# Patient Record
Sex: Male | Born: 1960 | Race: White | Hispanic: No | Marital: Married | Smoking: Never smoker
Health system: Southern US, Community
[De-identification: ages and names within clinical notes are randomized; demographics above are authoritative.]

## PROBLEM LIST (undated history)

## (undated) DIAGNOSIS — K56609 Unspecified intestinal obstruction, unspecified as to partial versus complete obstruction: Secondary | ICD-10-CM

## (undated) DIAGNOSIS — K219 Gastro-esophageal reflux disease without esophagitis: Secondary | ICD-10-CM

## (undated) DIAGNOSIS — H9190 Unspecified hearing loss, unspecified ear: Secondary | ICD-10-CM

## (undated) DIAGNOSIS — L03115 Cellulitis of right lower limb: Secondary | ICD-10-CM

## (undated) HISTORY — PX: COLON SURGERY: SHX602

---

## 1999-06-01 ENCOUNTER — Other Ambulatory Visit: Admission: RE | Admit: 1999-06-01 | Discharge: 1999-06-01 | Payer: Self-pay | Admitting: Urology

## 1999-07-17 ENCOUNTER — Ambulatory Visit (HOSPITAL_COMMUNITY): Admission: RE | Admit: 1999-07-17 | Discharge: 1999-07-17 | Payer: Self-pay | Admitting: Gastroenterology

## 1999-07-30 ENCOUNTER — Emergency Department (HOSPITAL_COMMUNITY): Admission: EM | Admit: 1999-07-30 | Discharge: 1999-07-31 | Payer: Self-pay | Admitting: Emergency Medicine

## 1999-08-22 ENCOUNTER — Ambulatory Visit (HOSPITAL_COMMUNITY): Admission: RE | Admit: 1999-08-22 | Discharge: 1999-08-22 | Payer: Self-pay | Admitting: Gastroenterology

## 1999-08-22 ENCOUNTER — Encounter: Payer: Self-pay | Admitting: Gastroenterology

## 2010-03-02 ENCOUNTER — Emergency Department (HOSPITAL_BASED_OUTPATIENT_CLINIC_OR_DEPARTMENT_OTHER): Admission: EM | Admit: 2010-03-02 | Discharge: 2010-03-03 | Payer: Self-pay | Admitting: Emergency Medicine

## 2013-01-20 ENCOUNTER — Emergency Department (INDEPENDENT_AMBULATORY_CARE_PROVIDER_SITE_OTHER)
Admission: EM | Admit: 2013-01-20 | Discharge: 2013-01-20 | Disposition: A | Discharge status: Home / Self Care | Payer: Worker's Compensation | Source: Home / Self Care

## 2013-01-20 DIAGNOSIS — M76829 Posterior tibial tendinitis, unspecified leg: Secondary | ICD-10-CM

## 2013-01-20 DIAGNOSIS — M76821 Posterior tibial tendinitis, right leg: Secondary | ICD-10-CM

## 2013-01-20 MED ORDER — KETOROLAC TROMETHAMINE 30 MG/ML IJ SOLN
30.0000 mg | Freq: Once | INTRAMUSCULAR | Status: AC
  Administered 2013-01-20: 30 mg via INTRAMUSCULAR

## 2013-01-20 MED ORDER — KETOROLAC TROMETHAMINE 30 MG/ML IJ SOLN
INTRAMUSCULAR | Status: AC
  Filled 2013-01-20: qty 1

## 2013-01-20 MED ORDER — KETOROLAC TROMETHAMINE 10 MG PO TABS: 10.0000 mg | ORAL_TABLET | Freq: Four times a day (QID) | ORAL | Status: DC | PRN

## 2013-01-20 NOTE — ED Provider Notes (Signed)
   History    CSN: [id number] Arrival date & time 01/20/13  1829  None    Chief Complaint  Patient presents with  . Leg Injury   (Consider location/radiation/quality/duration/timing/severity/associated sxs/prior Treatment) Patient is a 52 y.o. male presenting with leg pain. The history is provided by the patient.  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 No past surgical history on file. No family history on file. History  Substance Use Topics  . Smoking status: Not on file  . Smokeless tobacco: Not on file  . Alcohol Use: Not on file    Review of Systems  Constitutional: Negative.   Musculoskeletal: Positive for myalgias and gait problem. Negative for joint swelling.  Skin: Negative.     Allergies  Review of patient's allergies indicates no known allergies.  Home Medications   Current Outpatient Rx  Name  Route  Sig  Dispense  Refill  . ketorolac (TORADOL) 10 MG tablet   Oral   Take 1 tablet (10 mg total) by mouth every 6 (six) hours as needed for pain.   20 tablet   0    BP 124/81  Pulse 84  Temp(Src) 97.8 F (36.6 C) (Oral)  Resp 16  SpO2 100% Physical Exam  Nursing note and vitals reviewed. Constitutional: He is oriented to person, place, and time. He appears well-developed and well-nourished.  Musculoskeletal: He exhibits tenderness.       Right lower leg: He exhibits tenderness. He exhibits no bony tenderness, no swelling, no edema, no deformity and no laceration.       Legs: Neurological: He is alert and oriented to person, place, and time.  Skin: Skin is warm  and dry.    ED Course  Procedures (including critical care time) Labs Reviewed - No data to display No results found. 1. Tendonitis, tibialis, right     MDM    Linna Hoff, MD 01/20/13 2017

## 2013-01-20 NOTE — ED Notes (Signed)
Ice pack given to patient.

## 2013-01-20 NOTE — ED Notes (Signed)
Patient is here for right leg injury.  Patient was walking on a route and leg started hurting.

## 2013-01-21 ENCOUNTER — Emergency Department (HOSPITAL_BASED_OUTPATIENT_CLINIC_OR_DEPARTMENT_OTHER): Payer: 59

## 2013-01-21 ENCOUNTER — Encounter (HOSPITAL_BASED_OUTPATIENT_CLINIC_OR_DEPARTMENT_OTHER): Payer: Self-pay | Admitting: *Deleted

## 2013-01-21 ENCOUNTER — Emergency Department (HOSPITAL_BASED_OUTPATIENT_CLINIC_OR_DEPARTMENT_OTHER)
Admission: EM | Admit: 2013-01-21 | Discharge: 2013-01-21 | Disposition: A | Discharge status: Home / Self Care | Payer: 59 | Attending: Emergency Medicine | Admitting: Emergency Medicine

## 2013-01-21 DIAGNOSIS — L02419 Cutaneous abscess of limb, unspecified: Secondary | ICD-10-CM | POA: Insufficient documentation

## 2013-01-21 DIAGNOSIS — IMO0001 Reserved for inherently not codable concepts without codable children: Secondary | ICD-10-CM | POA: Insufficient documentation

## 2013-01-21 DIAGNOSIS — L0390 Cellulitis, unspecified: Secondary | ICD-10-CM

## 2013-01-21 DIAGNOSIS — Z792 Long term (current) use of antibiotics: Secondary | ICD-10-CM | POA: Insufficient documentation

## 2013-01-21 DIAGNOSIS — R21 Rash and other nonspecific skin eruption: Secondary | ICD-10-CM | POA: Insufficient documentation

## 2013-01-21 DIAGNOSIS — Z23 Encounter for immunization: Secondary | ICD-10-CM | POA: Insufficient documentation

## 2013-01-21 MED ORDER — CLINDAMYCIN PHOSPHATE 600 MG/50ML IV SOLN
600.0000 mg | Freq: Once | INTRAVENOUS | Status: AC
  Administered 2013-01-21: 600 mg via INTRAVENOUS
  Filled 2013-01-21: qty 50

## 2013-01-21 MED ORDER — TETANUS-DIPHTH-ACELL PERTUSSIS 5-2.5-18.5 LF-MCG/0.5 IM SUSP
0.5000 mL | Freq: Once | INTRAMUSCULAR | Status: AC
  Administered 2013-01-21: 0.5 mL via INTRAMUSCULAR
  Filled 2013-01-21: qty 0.5

## 2013-01-21 MED ORDER — CLINDAMYCIN HCL 300 MG PO CAPS: 300.0000 mg | ORAL_CAPSULE | Freq: Four times a day (QID) | ORAL | Status: DC

## 2013-01-21 NOTE — ED Notes (Signed)
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 Pt was seen at [facility] urgent care last night, given toradol injection and told to follow up. lle has larger area of redness and swelling noted to right calf, warm and tender to touch.

## 2013-01-21 NOTE — ED Notes (Signed)
Patient transported from MRI 

## 2013-01-21 NOTE — ED Provider Notes (Signed)
History    CSN: [id number] Arrival date & time 01/21/13  1041  First MD Initiated Contact with Patient 01/21/13 1110     Chief Complaint  Patient presents with  . Leg Pain   (Consider location/radiation/quality/duration/timing/severity/associated sxs/prior Treatment) HPI Comments: Patient presents with redness and swelling to his anterior right leg. He states he started noticing a little discomfort 2 days ago. 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 He states he's had some burning and tenderness on walking to his right lower leg. It's been constant but is worse with walking and driving. He's had some mild swelling to the area. He says at times that the pain radiates to his calf. He has some mild numbness in between the toes of his first 2 toes. He denies a fevers. He denies any chest pain or shortness of breath. He was seen in urgent care yesterday and was diagnosed with tendinitis. He states this morning the redness and swelling was worse so he came in here for evaluation. He was given a dose of Toradol in the urgent care last night. He's unsure when his last tetanus shot was. He denies any known injuries to the area.  Patient is a 52 y.o. male presenting with leg pain.  Leg Pain Associated symptoms: no back pain, no fatigue and no fever    History reviewed. No pertinent past medical history. History reviewed. No pertinent past surgical history. History reviewed. No pertinent family history. History  Substance Use Topics  . Smoking status: Not on file  . Smokeless tobacco: Not on file  . Alcohol Use: Not on file    Review of Systems  Constitutional: Negative for fever, chills, diaphoresis and fatigue.  HENT: Negative for congestion, rhinorrhea and sneezing.   Eyes: Negative.   Respiratory: Negative for cough, chest tightness and shortness of breath.   Cardiovascular: Negative for chest pain and leg swelling.  Gastrointestinal: Negative for nausea,  vomiting, abdominal pain, diarrhea and blood in stool.  Genitourinary: Negative for frequency, hematuria, flank pain and difficulty urinating.  Musculoskeletal: Positive for myalgias. Negative for back pain and arthralgias.  Skin: Positive for rash.  Neurological: Negative for dizziness, speech difficulty, weakness, numbness and headaches.    Allergies  Review of patient's allergies indicates no known allergies.  Home Medications   Current Outpatient Rx  Name  Route  Sig  Dispense  Refill  . clindamycin (CLEOCIN) 300 MG capsule   Oral   Take 1 capsule (300 mg total) by mouth 4 (four) times daily. X 7 days   28 capsule   0   . ketorolac (TORADOL) 10 MG tablet   Oral   Take 1 tablet (10 mg total) by mouth every 6 (six) hours as needed for pain.   20 tablet   0    BP 137/78  Pulse 86  Temp(Src) 98.5 F (36.9 C) (Oral)  Resp 18  SpO2 99% Physical Exam  Constitutional: He is oriented to person, place, and time. He appears well-developed and well-nourished.  HENT:  Head: Normocephalic and atraumatic.  Eyes: Pupils are equal, round, and reactive to light.  Neck: Normal range of motion. Neck supple.  Cardiovascular: Normal rate, regular rhythm and normal heart sounds.   Pulmonary/Chest: Effort normal and breath sounds normal. No respiratory distress. He has no wheezes. He has no rales. He exhibits no tenderness.  Abdominal: Soft. Bowel sounds are normal. There is no tenderness. There is no rebound and no guarding.  Musculoskeletal: Normal range of motion. He exhibits no edema.  Patient has some mild swelling with associated warmth and erythema to the anterior portion of his right lower leg. He has about a 6 cm area of erythema to this area. There's no induration or fluctuance. He has some tenderness on palpation of the erythematous area as well as to his posterior calf. He is neurovascularly intact distally.  Lymphadenopathy:    He has no cervical adenopathy.  Neurological: He  is alert and oriented to person, place, and time.  Skin: Skin is warm and dry. No rash noted.  Psychiatric: He has a normal mood and affect.    ED Course  Procedures (including critical care time) Labs Reviewed - No data to display US Venous Img Lower Unilateral Right  01/21/2013    *RADIOLOGY REPORT*  Right lower extremity venous duplex ultrasound  History:  Right lower extremity pain and swelling  Technique:  Real-time and Doppler interrogation of the right lower extremity venous system was performed.  Findings:  Flow in the venous structures of the right lower extremity is spontaneous and phasic in all segments.  There is normal compression and augmentation in the venous structures of the right lower extremity.  Venous Doppler signal is normal in all regions.  There is no thrombus in the deep or visualized superficial venous structures on the right.  There is no right- sided deep venous incompetence.  Conclusion:  No evidence of right lower extremity deep venous thrombosis.   Original Report Authenticated By: Bretta Bang, M.D.   1. Cellulitis     MDM  Patient no evidence of DVT. He was given a dose of IV clindamycin and started on oral clindamycin. He was advised and elevation the leg. He was advised to return in 2 days for recheck or sooner if his symptoms worsen or the redness starts to spread.  Rolan Bucco, MD 01/21/13 1318

## 2013-01-22 NOTE — ED Notes (Signed)
Call from patient on answering machine , @9 :58a , yesterday 7-10, w request call back. Returned call , and patient stated he was seen in another facility at the direction of the nurse care line

## 2013-01-23 ENCOUNTER — Emergency Department (HOSPITAL_BASED_OUTPATIENT_CLINIC_OR_DEPARTMENT_OTHER)
Admission: EM | Admit: 2013-01-23 | Discharge: 2013-01-23 | Disposition: A | Discharge status: Home / Self Care | Payer: 59 | Attending: Emergency Medicine | Admitting: Emergency Medicine

## 2013-01-23 ENCOUNTER — Encounter (HOSPITAL_BASED_OUTPATIENT_CLINIC_OR_DEPARTMENT_OTHER): Payer: Self-pay

## 2013-01-23 DIAGNOSIS — L03119 Cellulitis of unspecified part of limb: Secondary | ICD-10-CM | POA: Insufficient documentation

## 2013-01-23 DIAGNOSIS — M7989 Other specified soft tissue disorders: Secondary | ICD-10-CM | POA: Insufficient documentation

## 2013-01-23 DIAGNOSIS — L03115 Cellulitis of right lower limb: Secondary | ICD-10-CM

## 2013-01-23 DIAGNOSIS — L02419 Cutaneous abscess of limb, unspecified: Secondary | ICD-10-CM | POA: Insufficient documentation

## 2013-01-23 HISTORY — DX: Cellulitis of right lower limb: L03.115

## 2013-01-23 NOTE — ED Notes (Signed)
MD at bedside. 

## 2013-01-23 NOTE — ED Notes (Signed)
Recheck of right leg.  Pt was seen and treated for cellulitis of right leg on Thursday.

## 2013-01-23 NOTE — ED Provider Notes (Signed)
History    CSN: [id number] Arrival date & time 01/23/13  1009  First MD Initiated Contact with Patient 01/23/13 1028     Chief Complaint  Patient presents with  . Follow-up   (Consider location/radiation/quality/duration/timing/severity/associated sxs/prior Treatment) HPI Comments: Pt had developed redness, swelling, pain worse with standing or walking early in the week, seen in the ED 2 days ago, had negative U/S and treated with IV abx.  Has been taking oral abx, keeping leg elevated.  Was told to get recheck today.  No fevers at home.  Reports redness, pain, swelling are all improved.    Patient is a 52 y.o. male presenting with wound check. The history is provided by the patient, the spouse and medical records.  Wound Check This is a new problem. Pertinent negatives include no chest pain and no shortness of breath. The symptoms are relieved by position and lying down.   Past Medical History  Diagnosis Date  . Cellulitis of right anterior lower leg    History reviewed. No pertinent past surgical history. History reviewed. No pertinent family history. History  Substance Use Topics  . Smoking status: Never Smoker   . Smokeless tobacco: Not on file  . Alcohol Use: No    Review of Systems  Constitutional: Negative for fever and chills.  Respiratory: Negative for shortness of breath.   Cardiovascular: Positive for leg swelling. Negative for chest pain.  Skin: Positive for color change.    Allergies  Review of patient's allergies indicates no known allergies.  Home Medications   Current Outpatient Rx  Name  Route  Sig  Dispense  Refill  . clindamycin (CLEOCIN) 300 MG capsule   Oral   Take 1 capsule (300 mg total) by mouth 4 (four) times daily. X 7 days   28 capsule   0   . ketorolac (TORADOL) 10 MG tablet   Oral   Take 1 tablet (10 mg total) by mouth every 6 (six) hours as needed for pain.   20 tablet   0    BP 132/66  Pulse 81  Temp(Src) 98.4 F (36.9 C)  (Oral)  Resp 16  Ht 5\' 11"  (1.803 m)  Wt 230 lb (104.327 kg)  BMI 32.09 kg/m2  SpO2 96% Physical Exam  Nursing note and vitals reviewed. Constitutional: He appears well-developed and well-nourished. No distress.  HENT:  Head: Normocephalic and atraumatic.  Cardiovascular: Normal pulses.   Musculoskeletal:       Right lower leg: He exhibits swelling. He exhibits no tenderness, no bony tenderness, no edema, no deformity and no laceration.  Neurological: He is alert. He displays no atrophy and no tremor. No sensory deficit. He exhibits normal muscle tone. GCS eye subscore is 4. GCS verbal subscore is 5. GCS motor subscore is 6.  Skin: Skin is warm and dry. No rash noted. He is not diaphoretic.    ED Course  Procedures (including critical care time) Labs Reviewed - No data to display US Venous Img Lower Unilateral Right  01/21/2013    *RADIOLOGY REPORT*  Right lower extremity venous duplex ultrasound  History:  Right lower extremity pain and swelling  Technique:  Real-time and Doppler interrogation of the right lower extremity venous system was performed.  Findings:  Flow in the venous structures of the right lower extremity is spontaneous and phasic in all segments.  There is normal compression and augmentation in the venous structures of the right lower extremity.  Venous Doppler signal is normal in all  regions.  There is no thrombus in the deep or visualized superficial venous structures on the right.  There is no right- sided deep venous incompetence.  Conclusion:  No evidence of right lower extremity deep venous thrombosis.   Original Report Authenticated By: Bretta Bang, M.D.   1. Cellulitis of right lower leg    ra sat is 96% and I interpret to be adequate MDM  Pt's swelling, redness and pain are all improving.  No fevers, dizziness.  Appears to be healing well.  Pt will continue oral abx and follow up with PCP on Tuesday to determine ability to return to work at that time.     Gavin Pound. Tomia Enlow, MD 01/23/13 1128

## 2013-01-23 NOTE — Discharge Instructions (Signed)
Cellulitis Cellulitis is an infection of the skin and the tissue beneath it. The infected area is usually red and tender. Cellulitis occurs most often in the arms and lower legs.  CAUSES  Cellulitis is caused by bacteria that enter the skin through cracks or cuts in the skin. The most common types of bacteria that cause cellulitis are Staphylococcus and Streptococcus. SYMPTOMS   Redness and warmth.  Swelling.  Tenderness or pain.  Fever. DIAGNOSIS  Your caregiver can usually determine what is wrong based on a physical exam. Blood tests may also be done. TREATMENT  Treatment usually involves taking an antibiotic medicine. HOME CARE INSTRUCTIONS   Take your antibiotics as directed. Finish them even if you start to feel better.  Keep the infected arm or leg elevated to reduce swelling.  Apply a warm cloth to the affected area up to 4 times per day to relieve pain.  Only take over-the-counter or prescription medicines for pain, discomfort, or fever as directed by your caregiver.  Keep all follow-up appointments as directed by your caregiver. SEEK MEDICAL CARE IF:   You notice red streaks coming from the infected area.  Your red area gets larger or turns dark in color.  Your bone or joint underneath the infected area becomes painful after the skin has healed.  Your infection returns in the same area or another area.  You notice a swollen bump in the infected area.  You develop new symptoms. SEEK IMMEDIATE MEDICAL CARE IF:   You have a fever.  You feel very sleepy.  You develop vomiting or diarrhea.  You have a general ill feeling (malaise) with muscle aches and pains. MAKE SURE YOU:   Understand these instructions.  Will watch your condition.  Will get help right away if you are not doing well or get worse. Document Released: 04/10/2005 Document Revised: 12/31/2011 Document Reviewed: 09/16/2011 Exit[employer] Patient Information 2014 [employer].  

## 2013-10-20 ENCOUNTER — Ambulatory Visit
Admission: RE | Admit: 2013-10-20 | Discharge: 2013-10-20 | Disposition: A | Discharge status: Home / Self Care | Payer: 59 | Source: Ambulatory Visit | Attending: Family Medicine | Admitting: Family Medicine

## 2013-10-20 ENCOUNTER — Other Ambulatory Visit: Payer: Self-pay | Admitting: Family Medicine

## 2013-10-20 DIAGNOSIS — R05 Cough: Secondary | ICD-10-CM

## 2013-10-20 DIAGNOSIS — R059 Cough, unspecified: Secondary | ICD-10-CM

## 2013-10-20 DIAGNOSIS — R509 Fever, unspecified: Secondary | ICD-10-CM

## 2013-10-29 ENCOUNTER — Emergency Department (HOSPITAL_COMMUNITY)
Admission: EM | Admit: 2013-10-29 | Discharge: 2013-10-29 | Disposition: A | Discharge status: Home / Self Care | Attending: Emergency Medicine | Admitting: Emergency Medicine

## 2013-10-29 ENCOUNTER — Encounter (HOSPITAL_COMMUNITY): Payer: Self-pay | Admitting: Emergency Medicine

## 2013-10-29 DIAGNOSIS — Y9389 Activity, other specified: Secondary | ICD-10-CM | POA: Diagnosis not present

## 2013-10-29 DIAGNOSIS — Z872 Personal history of diseases of the skin and subcutaneous tissue: Secondary | ICD-10-CM | POA: Diagnosis not present

## 2013-10-29 DIAGNOSIS — Z23 Encounter for immunization: Secondary | ICD-10-CM | POA: Diagnosis not present

## 2013-10-29 DIAGNOSIS — S61409A Unspecified open wound of unspecified hand, initial encounter: Secondary | ICD-10-CM | POA: Insufficient documentation

## 2013-10-29 DIAGNOSIS — W540XXA Bitten by dog, initial encounter: Secondary | ICD-10-CM | POA: Diagnosis not present

## 2013-10-29 DIAGNOSIS — Y990 Civilian activity done for income or pay: Secondary | ICD-10-CM | POA: Diagnosis not present

## 2013-10-29 DIAGNOSIS — Y9289 Other specified places as the place of occurrence of the external cause: Secondary | ICD-10-CM | POA: Diagnosis not present

## 2013-10-29 MED ORDER — AMOXICILLIN-POT CLAVULANATE 875-125 MG PO TABS: 1.0000 | ORAL_TABLET | Freq: Two times a day (BID) | ORAL | Status: DC

## 2013-10-29 MED ORDER — TETANUS-DIPHTH-ACELL PERTUSSIS 5-2.5-18.5 LF-MCG/0.5 IM SUSP
0.5000 mL | Freq: Once | INTRAMUSCULAR | Status: AC
  Administered 2013-10-29: 0.5 mL via INTRAMUSCULAR
  Filled 2013-10-29: qty 0.5

## 2013-10-29 NOTE — ED Provider Notes (Signed)
Medical screening examination/treatment/procedure(s) were performed by non-physician practitioner and as supervising physician I was immediately available for consultation/collaboration.   EKG Interpretation None        Ianmichael Amescua, MD 10/29/13 2143 

## 2013-10-29 NOTE — ED Provider Notes (Signed)
CSN: [id number]     Arrival date & time 10/29/13  1620 History  This chart was scribed for non-physician practitioner, Emilia BeckKaitlyn Sophya Vanblarcom, PA-C, working with Gwyneth SproutWhitney Plunkett, MD by Charline BillsEssence Howell, ED Scribe. This patient was seen in room WTR7/WTR7 and the patient's care was started at 5:10 PM.    Chief Complaint  Patient presents with  . Animal Bite   Patient is a 53 y.o. male presenting with animal bite. The history is provided by the patient. No language interpreter was used.  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 male who presents to the Emergency Department complaining of dog bite on R hand that occurred today. 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 Pt states that he put up a hand-held shield that the dog bit through. Pt reports associated pain earlier today that has resolved. Bleeding is controlled. The dog's vaccines are UTD. Pt's Tetanus unknown.  Past Medical History  Diagnosis Date  . Cellulitis of right anterior lower leg    History reviewed. No pertinent past surgical history. History reviewed. No pertinent family history. History  Substance Use Topics  . Smoking status: Never Smoker   . Smokeless tobacco: Not on file  . Alcohol Use: No    Review of Systems  All other systems reviewed and are negative.   Allergies  Review of patient's allergies indicates no known allergies.  Home Medications   Prior to Admission medications   Medication Sig Start Date End Date Taking? Authorizing Provider  clindamycin (CLEOCIN) 300 MG capsule Take 1 capsule (300 mg total) by mouth 4 (four) times daily.  X 7 days 01/21/13   Rolan BuccoMelanie Belfi, MD  ketorolac (TORADOL) 10 MG tablet Take 1 tablet (10 mg total) by mouth every 6 (six) hours as needed for pain. 01/20/13   Linna HoffJames D Kindl, MD   Triage Vitals: BP 136/84  Pulse 84  Temp(Src) 98.8 F (37.1 C) (Oral)  Resp 16  SpO2 99% Physical Exam  Nursing note and vitals reviewed. Constitutional: He is oriented to person, place, and time. He appears well-developed and well-nourished. No distress.  HENT:  Head: Normocephalic and atraumatic.  Eyes: EOM are normal.  Neck: Neck supple.  Cardiovascular: Normal rate.   Pulmonary/Chest: Effort normal. No respiratory distress.  Musculoskeletal: Normal range of motion.  Full ROM of R hand    Neurological: He is alert and oriented to person, place, and time.  Skin: Skin is warm and dry.  Puncture wound noted to volar aspect of first MCP  Bleeding controlled No drainage  Distal capillary refill intact Sensation intact  Psychiatric: He has a normal mood and affect. His behavior is normal.    ED Course  Procedures (including critical care time) DIAGNOSTIC STUDIES: Oxygen Saturation is 99% on RA, normal by my interpretation.    COORDINATION OF CARE: 5:13 PM-Discussed treatment plan which includes saline bath and antibiotics with pt at bedside and pt agreed to plan.   Labs Review Labs Reviewed - No data to display  Imaging Review No results found.   EKG Interpretation None      MDM  Final diagnoses:  Dog bite    6:00 PM Patient's wound cleaned and bandaged with bacitracin ointment. Patient given tdap. Vitals stable and patient afebrile. Patient will be discharged with Augmentin for dog bite. No neurovascular compromise. Patient advised to return with worsening or concerning symptoms.   I personally performed the services described in this documentation, which was scribed in my presence. The recorded information has been reviewed and is accurate.    Emilia BeckKaitlyn Bentzion Dauria, PA-C 10/29/13  1801

## 2013-10-29 NOTE — ED Notes (Addendum)
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   EMS vitals: 140/98, HR 90, 16 RR,. Pt denies pain at this time. Bleeding was controlled on scene and EMS wrapped hand with gauze.

## 2013-10-29 NOTE — Discharge Instructions (Signed)
Keep wound clean and covered. Take antibiotics as directed until gone. Refer to attached documents for more information.

## 2014-02-03 ENCOUNTER — Encounter (HOSPITAL_BASED_OUTPATIENT_CLINIC_OR_DEPARTMENT_OTHER): Payer: Self-pay | Admitting: Emergency Medicine

## 2014-02-03 ENCOUNTER — Emergency Department (HOSPITAL_BASED_OUTPATIENT_CLINIC_OR_DEPARTMENT_OTHER): Payer: Managed Care, Other (non HMO)

## 2014-02-03 ENCOUNTER — Inpatient Hospital Stay (HOSPITAL_BASED_OUTPATIENT_CLINIC_OR_DEPARTMENT_OTHER)
Admission: EM | Admit: 2014-02-03 | Discharge: 2014-02-07 | DRG: 390 | Disposition: A | Discharge status: Home / Self Care | Payer: Managed Care, Other (non HMO) | Attending: General Surgery | Admitting: General Surgery

## 2014-02-03 DIAGNOSIS — D72829 Elevated white blood cell count, unspecified: Secondary | ICD-10-CM | POA: Diagnosis present

## 2014-02-03 DIAGNOSIS — K56609 Unspecified intestinal obstruction, unspecified as to partial versus complete obstruction: Secondary | ICD-10-CM

## 2014-02-03 DIAGNOSIS — Z79899 Other long term (current) drug therapy: Secondary | ICD-10-CM | POA: Diagnosis not present

## 2014-02-03 DIAGNOSIS — E869 Volume depletion, unspecified: Secondary | ICD-10-CM | POA: Diagnosis present

## 2014-02-03 DIAGNOSIS — R1110 Vomiting, unspecified: Secondary | ICD-10-CM | POA: Diagnosis not present

## 2014-02-03 DIAGNOSIS — K565 Intestinal adhesions [bands], unspecified as to partial versus complete obstruction: Principal | ICD-10-CM | POA: Diagnosis present

## 2014-02-03 LAB — CBC
HEMATOCRIT: 44.1 % (ref 39.0–52.0)
Hemoglobin: 15.4 g/dL (ref 13.0–17.0)
MCH: 32 pg (ref 26.0–34.0)
MCHC: 34.9 g/dL (ref 30.0–36.0)
MCV: 91.7 fL (ref 78.0–100.0)
Platelets: 170 10*3/uL (ref 150–400)
RBC: 4.81 MIL/uL (ref 4.22–5.81)
RDW: 12.3 % (ref 11.5–15.5)
WBC: 13.3 10*3/uL — ABNORMAL HIGH (ref 4.0–10.5)

## 2014-02-03 LAB — COMPREHENSIVE METABOLIC PANEL
ALBUMIN: 5 g/dL (ref 3.5–5.2)
ALK PHOS: 72 U/L (ref 39–117)
ALT: 20 U/L (ref 0–53)
AST: 24 U/L (ref 0–37)
Anion gap: 19 — ABNORMAL HIGH (ref 5–15)
BUN: 30 mg/dL — ABNORMAL HIGH (ref 6–23)
CALCIUM: 10.4 mg/dL (ref 8.4–10.5)
CO2: 25 mEq/L (ref 19–32)
Chloride: 98 mEq/L (ref 96–112)
Creatinine, Ser: 1.2 mg/dL (ref 0.50–1.35)
GFR calc non Af Amer: 67 mL/min — ABNORMAL LOW (ref >90)
GFR, EST AFRICAN AMERICAN: 78 mL/min — AB (ref >90)
GLUCOSE: 125 mg/dL — AB (ref 70–99)
POTASSIUM: 4.3 meq/L (ref 3.7–5.3)
SODIUM: 142 meq/L (ref 137–147)
TOTAL PROTEIN: 8.9 g/dL — AB (ref 6.0–8.3)
Total Bilirubin: 2.1 mg/dL — ABNORMAL HIGH (ref 0.3–1.2)

## 2014-02-03 LAB — HEMOGLOBIN A1C
HEMOGLOBIN A1C: 5.4 % (ref <5.7)
MEAN PLASMA GLUCOSE: 108 mg/dL (ref <117)

## 2014-02-03 LAB — CBC WITH DIFFERENTIAL/PLATELET
BASOS PCT: 0 % (ref 0–1)
Basophils Absolute: 0 10*3/uL (ref 0.0–0.1)
EOS ABS: 0.1 10*3/uL (ref 0.0–0.7)
EOS PCT: 0 % (ref 0–5)
HCT: 48.9 % (ref 39.0–52.0)
Hemoglobin: 17.4 g/dL — ABNORMAL HIGH (ref 13.0–17.0)
LYMPHS ABS: 1.1 10*3/uL (ref 0.7–4.0)
Lymphocytes Relative: 6 % — ABNORMAL LOW (ref 12–46)
MCH: 31.7 pg (ref 26.0–34.0)
MCHC: 35.6 g/dL (ref 30.0–36.0)
MCV: 89.1 fL (ref 78.0–100.0)
Monocytes Absolute: 1.7 10*3/uL — ABNORMAL HIGH (ref 0.1–1.0)
Monocytes Relative: 10 % (ref 3–12)
NEUTROS PCT: 84 % — AB (ref 43–77)
Neutro Abs: 14.5 10*3/uL — ABNORMAL HIGH (ref 1.7–7.7)
Platelets: 235 10*3/uL (ref 150–400)
RBC: 5.49 MIL/uL (ref 4.22–5.81)
RDW: 12.3 % (ref 11.5–15.5)
WBC: 17.3 10*3/uL — ABNORMAL HIGH (ref 4.0–10.5)

## 2014-02-03 LAB — CREATININE, SERUM
CREATININE: 1.01 mg/dL (ref 0.50–1.35)
GFR calc Af Amer: >90 mL/min (ref >90)
GFR, EST NON AFRICAN AMERICAN: 83 mL/min — AB (ref >90)

## 2014-02-03 LAB — LIPASE, BLOOD: Lipase: 25 U/L (ref 11–59)

## 2014-02-03 MED ORDER — SODIUM CHLORIDE 0.9 % IV SOLN: INTRAVENOUS | Status: DC

## 2014-02-03 MED ORDER — ONDANSETRON HCL 4 MG/2ML IJ SOLN: 4.0000 mg | Freq: Four times a day (QID) | INTRAMUSCULAR | Status: DC | PRN

## 2014-02-03 MED ORDER — ACETAMINOPHEN 650 MG RE SUPP: 650.0000 mg | Freq: Four times a day (QID) | RECTAL | Status: DC | PRN

## 2014-02-03 MED ORDER — ACETAMINOPHEN 325 MG PO TABS
650.0000 mg | ORAL_TABLET | Freq: Four times a day (QID) | ORAL | Status: DC | PRN
  Administered 2014-02-04 – 2014-02-06 (×5): 650 mg via ORAL
  Filled 2014-02-03 (×5): qty 2

## 2014-02-03 MED ORDER — ONDANSETRON HCL 4 MG PO TABS: 4.0000 mg | ORAL_TABLET | Freq: Four times a day (QID) | ORAL | Status: DC | PRN

## 2014-02-03 MED ORDER — MORPHINE SULFATE 4 MG/ML IJ SOLN
INTRAMUSCULAR | Status: AC
  Filled 2014-02-03: qty 1

## 2014-02-03 MED ORDER — MORPHINE SULFATE 4 MG/ML IJ SOLN
4.0000 mg | INTRAMUSCULAR | Status: DC | PRN
  Administered 2014-02-03: 4 mg via INTRAVENOUS

## 2014-02-03 MED ORDER — DEXTROSE-NACL 5-0.9 % IV SOLN
INTRAVENOUS | Status: DC
  Administered 2014-02-03: 18:00:00 via INTRAVENOUS

## 2014-02-03 MED ORDER — HEPARIN SODIUM (PORCINE) 5000 UNIT/ML IJ SOLN
5000.0000 [IU] | Freq: Three times a day (TID) | INTRAMUSCULAR | Status: DC
  Administered 2014-02-03 – 2014-02-07 (×11): 5000 [IU] via SUBCUTANEOUS
  Filled 2014-02-03 (×14): qty 1

## 2014-02-03 MED ORDER — MORPHINE SULFATE 4 MG/ML IJ SOLN
4.0000 mg | Freq: Once | INTRAMUSCULAR | Status: AC
  Administered 2014-02-03: 4 mg via INTRAVENOUS
  Filled 2014-02-03: qty 1

## 2014-02-03 MED ORDER — IOHEXOL 300 MG/ML  SOLN
100.0000 mL | Freq: Once | INTRAMUSCULAR | Status: AC | PRN
  Administered 2014-02-03: 100 mL via INTRAVENOUS

## 2014-02-03 MED ORDER — MORPHINE SULFATE 2 MG/ML IJ SOLN
1.0000 mg | INTRAMUSCULAR | Status: DC | PRN
  Administered 2014-02-04: 1 mg via INTRAVENOUS
  Filled 2014-02-03: qty 1

## 2014-02-03 MED ORDER — DEXTROSE-NACL 5-0.9 % IV SOLN
INTRAVENOUS | Status: DC
  Administered 2014-02-03: 75 mL/h via INTRAVENOUS
  Administered 2014-02-05: 01:00:00 via INTRAVENOUS

## 2014-02-03 MED ORDER — ONDANSETRON HCL 4 MG/2ML IJ SOLN
4.0000 mg | Freq: Once | INTRAMUSCULAR | Status: AC
  Administered 2014-02-03: 4 mg via INTRAVENOUS
  Filled 2014-02-03: qty 2

## 2014-02-03 MED ORDER — SODIUM CHLORIDE 0.9 % IV BOLUS (SEPSIS)
1000.0000 mL | Freq: Once | INTRAVENOUS | Status: AC
  Administered 2014-02-03: 1000 mL via INTRAVENOUS

## 2014-02-03 MED ORDER — PANTOPRAZOLE SODIUM 40 MG IV SOLR
40.0000 mg | INTRAVENOUS | Status: DC
  Administered 2014-02-03 – 2014-02-06 (×4): 40 mg via INTRAVENOUS
  Filled 2014-02-03 (×5): qty 40

## 2014-02-03 MED ORDER — IOHEXOL 300 MG/ML  SOLN
50.0000 mL | Freq: Once | INTRAMUSCULAR | Status: AC | PRN
  Administered 2014-02-03: 50 mL via ORAL

## 2014-02-03 NOTE — ED Notes (Signed)
[facility]

## 2014-02-03 NOTE — H&P (Signed)
[facility] History and Physical  George Ruiz ZOX:[id number] DOB: 12/26/1960 DOA: 02/03/2014  Referring physician: Rochele RaringKristen Ward, DO PCP: Thora LanceEHINGER,ROBERT R, MD   Chief Complaint: Vomiting  HPI: George Ruiz is a 53 y.o. male who has a history of partial bowel removal when he was a teen presents with complaints of vomiting and abdominal pain. [facility] with these symptoms an xray was abnormal and he was told to go to the ED. Patient states that he had been fine until Wednesday and even had a normal BM at that time. He has not been able to keep anything down including liquids. [facility] he was noted to have SBO and sent here for further evaluation and assessment. He does admit having a history of an appendectomy and also abdominal surgery as a teen. Patient states that he does not smoke and does not drink heavily. He is on no other medications.   Review of Systems:  Constitutional:  No weight loss, night sweats, Fevers, chills, fatigue.  HEENT:  No headaches Cardio-vascular:  No chest pain, Orthopnea, PND, palpitations  GI:  No heartburn, ++indigestion, ++abdominal pain, ++nausea, ++vomiting, no diarrhea, ++loss of appetite  Resp:  No shortness of breath with exertion or at rest. No excess mucus, no productive cough, No coughing up of blood.No wheezing  Skin:  no rash or lesions.  GU:  no dysuria, change in color of urine, no urgency or frequency. Musculoskeletal:  No joint pain or swelling. No decreased range of motion. Psych:  No change in mood or affect. No depression or anxiety. No memory loss.   Past Medical History  Diagnosis Date  . Cellulitis of right anterior lower leg    Past Surgical History  Procedure Laterality Date  . Colon surgery     Social History:  reports that he has never smoked. He does not have any smokeless tobacco history on file. He reports that he does not drink alcohol or use illicit  drugs.  No Known Allergies  History reviewed. No pertinent family history.   Prior to Admission medications   Medication Sig Start Date End Date Taking? Authorizing Provider  acetaminophen (TYLENOL) 325 MG tablet Take 650 mg by mouth every 6 (six) hours as needed (pain).   Yes Historical Provider, MD   Physical Exam: Filed Vitals:   02/03/14 1200 02/03/14 1449  BP: 121/93 125/72  Pulse: 76 75  Temp: 97.9 F (36.6 C) 98.8 F (37.1 C)  TempSrc: Oral Oral  Resp: 16 18  Height: 5\' 11"  (1.803 m)   Weight: 108.863 kg (240 lb)   SpO2: 100% 95%    Wt Readings from Last 3 Encounters:  02/03/14 108.863 kg (240 lb)  01/23/13 104.327 kg (230 lb)    General:  Appears calm and comfortable Eyes: PERRL, normal lids, irises & conjunctiva ENT: grossly normal hearing, lips & tongue Neck: no LAD, masses or thyromegaly Cardiovascular: RRR, no m/r/g. No LE edema. Respiratory: CTA bilaterally, no w/r/r. Normal respiratory effort. Abdomen: soft, ntnd no organomegaly Skin: no rash or induration seen on limited exam Musculoskeletal: grossly normal tone BUE/BLE Psychiatric: grossly normal mood and affect, speech fluent and appropriate Neurologic: grossly non-focal.          Labs on Admission:  Basic Metabolic Panel:  Recent Labs Lab 02/03/14 1215  NA 142  K 4.3  CL 98  CO2 25  GLUCOSE 125*  BUN 30*  CREATININE 1.20  CALCIUM 10.4  Liver Function Tests:  Recent Labs Lab 02/03/14 1215  AST 24  ALT 20  ALKPHOS 72  BILITOT 2.1*  PROT 8.9*  ALBUMIN 5.0    Recent Labs Lab 02/03/14 1215  LIPASE 25   No results found for this basename: AMMONIA,  in the last 168 hours CBC:  Recent Labs Lab 02/03/14 1215  WBC 17.3*  NEUTROABS 14.5*  HGB 17.4*  HCT 48.9  MCV 89.1  PLT 235   Cardiac Enzymes: No results found for this basename: CKTOTAL, CKMB, CKMBINDEX, TROPONINI,  in the last 168 hours  BNP (last 3 results) No results found for this basename: PROBNP,  in the  last 8760 hours CBG: No results found for this basename: GLUCAP,  in the last 168 hours  Radiological Exams on Admission: Ct Abdomen Pelvis W Contrast  02/03/2014   CLINICAL DATA:  Abdominal pain  EXAM: CT ABDOMEN AND PELVIS WITH CONTRAST  TECHNIQUE: Multidetector CT imaging of the abdomen and pelvis was performed using the standard protocol following bolus administration of intravenous contrast.  CONTRAST:  50mL OMNIPAQUE IOHEXOL 300 MG/ML SOLN, OMNIPAQUE IOHEXOL 300 MG/ML SOLN  COMPARISON:  None.  FINDINGS: Distended proximal small bowel loops and decompressed distal small bowel loops are noted. Fecalized material within mid mid small bowel in the lower abdomen on image 68 is present just proximal to the transition point. No obvious apparent cause of obstruction. Colon is decompressed. Air-fluid levels in distended small bowel are noted. No free intraperitoneal gas. No pneumatosis. Stomach is distended.  Diffuse hepatic steatosis.  Gallbladder, spleen, pancreas, adrenal glands, and kidneys are within normal limits.  Bladder and prostate are unremarkable.  No free-fluid.  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  IMPRESSION: Small bowel obstruction pattern. The transition point occurs mid small bowel in the lower abdomen without apparent cause. This suggests adhesions.   Electronically Signed   By: Maryclare Bean M.D.   On: 02/03/2014 13:43      Assessment/Plan Principal Problem:   Small bowel obstruction Active Problems:   SBO (small bowel obstruction)   1. SBO -will keep NPO -Surgery consult -will keep NG tube to drainage -labs reviewed -will start on IVF  2. Nausea Vomiting -antiemetics as needed -Keep NPO -NG tube  3. Mild Dehydration -will hydrate with IVF as tolerated  4. Elevated glucose -will monitor -check A1C    Code Status: Full Code (must indicate code status--if unknown or must be presumed, indicate so) DVT  Prophylaxis:heparin Family Communication: None (indicate person spoken with, if applicable, with phone number if by telephone) Disposition Plan: Home (indicate anticipated LOS)  Time spent:  [facility] Pager [phone]  **Disclaimer: This note may have been dictated with voice recognition software. Similar sounding words can inadvertently be transcribed and this note may contain transcription errors which may not have been corrected upon publication of note.**

## 2014-02-03 NOTE — ED Notes (Signed)
Report given to [facility]aqueline [facility]

## 2014-02-03 NOTE — Consult Note (Signed)
Reason for Consult:  Small bowel obstruction Referring Physician:  Dr. Einar Pheasant RUFORD DUDZINSKI is an 53 y.o. male.  HPI:  He was in his normal state of health until Wednesday afternoon when he began developing some cramping abdominal pain and distention. He subsequently vomited 4 times-the contents of what he had to eat that day.  She took over-the-counter medication for nausea I was able to sleep through the night. This morning he woke up very first day and drank about 30 ounces of Powerade and had a small meal. He subsequently through that up. He presented to [facility] where he was evaluated. CT scan suggested a small bowel obstruction with a transition point. He was transferred to [facility] hospital and admitted.  Last bowel movement was yesterday. No hematemesis.  Past Medical History  Diagnosis Date  . Cellulitis of right anterior lower leg     Past Surgical History  Procedure Laterality Date  . Colon surgery       Appendectomy  History reviewed. No pertinent family history.  Social History:  reports that he has never smoked. He does not have any smokeless tobacco history on file. He reports that he does not drink alcohol or use illicit drugs.  Allergies: No Known Allergies  Prior to Admission medications   Medication Sig Start Date End Date Taking? Authorizing Provider  acetaminophen (TYLENOL) 325 MG tablet Take 650 mg by mouth every 6 (six) hours as needed (pain).   Yes Historical Provider, MD     Results for orders placed during the hospital encounter of 02/03/14 (from the past 48 hour(s))  CBC WITH DIFFERENTIAL     Status: Abnormal   Collection Time    02/03/14 12:15 PM      Result Value Ref Range   WBC 17.3 (*) 4.0 - 10.5 K/uL   RBC 5.49  4.22 - 5.81 MIL/uL   Hemoglobin 17.4 (*) 13.0 - 17.0 g/dL   HCT 48.9  39.0 - 52.0 %   MCV 89.1  78.0 - 100.0 fL   MCH 31.7  26.0 - 34.0 pg   MCHC 35.6  30.0 - 36.0 g/dL   RDW 12.3  11.5 - 15.5 %   Platelets 235  150 - 400 K/uL    Neutrophils Relative % 84 (*) 43 - 77 %   Neutro Abs 14.5 (*) 1.7 - 7.7 K/uL   Lymphocytes Relative 6 (*) 12 - 46 %   Lymphs Abs 1.1  0.7 - 4.0 K/uL   Monocytes Relative 10  3 - 12 %   Monocytes Absolute 1.7 (*) 0.1 - 1.0 K/uL   Eosinophils Relative 0  0 - 5 %   Eosinophils Absolute 0.1  0.0 - 0.7 K/uL   Basophils Relative 0  0 - 1 %   Basophils Absolute 0.0  0.0 - 0.1 K/uL  COMPREHENSIVE METABOLIC PANEL     Status: Abnormal   Collection Time    02/03/14 12:15 PM      Result Value Ref Range   Sodium 142  137 - 147 mEq/L   Potassium 4.3  3.7 - 5.3 mEq/L   Chloride 98  96 - 112 mEq/L   CO2 25  19 - 32 mEq/L   Glucose, Bld 125 (*) 70 - 99 mg/dL   BUN 30 (*) 6 - 23 mg/dL   Creatinine, Ser 1.20  0.50 - 1.35 mg/dL   Calcium 10.4  8.4 - 10.5 mg/dL   Total Protein 8.9 (*) 6.0 - 8.3  g/dL   Albumin 5.0  3.5 - 5.2 g/dL   AST 24  0 - 37 U/L   ALT 20  0 - 53 U/L   Alkaline Phosphatase 72  39 - 117 U/L   Total Bilirubin 2.1 (*) 0.3 - 1.2 mg/dL   GFR calc non Af Amer 67 (*) >90 mL/min   GFR calc Af Amer 78 (*) >90 mL/min   Comment: (NOTE)     The eGFR has been calculated using the CKD EPI equation.     This calculation has not been validated in all clinical situations.     eGFR's persistently <90 mL/min signify possible Chronic Kidney     Disease.   Anion gap 19 (*) 5 - 15  LIPASE, BLOOD     Status: None   Collection Time    02/03/14 12:15 PM      Result Value Ref Range   Lipase 25  11 - 59 U/L    Ct Abdomen Pelvis W Contrast  02/03/2014   CLINICAL DATA:  Abdominal pain  EXAM: CT ABDOMEN AND PELVIS WITH CONTRAST  TECHNIQUE: Multidetector CT imaging of the abdomen and pelvis was performed using the standard protocol following bolus administration of intravenous contrast.  CONTRAST:  28m OMNIPAQUE IOHEXOL 300 MG/ML SOLN, 1048mOMNIPAQUE IOHEXOL 300 MG/ML SOLN  COMPARISON:  None.  FINDINGS: Distended proximal small bowel loops and decompressed distal small bowel loops are noted.  Fecalized material within mid mid small bowel in the lower abdomen on image 68 is present just proximal to the transition point. No obvious apparent cause of obstruction. Colon is decompressed. Air-fluid levels in distended small bowel are noted. No free intraperitoneal gas. No pneumatosis. Stomach is distended.  Diffuse hepatic steatosis.  Gallbladder, spleen, pancreas, adrenal glands, and kidneys are within normal limits.  Bladder and prostate are unremarkable.  No free-fluid.  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  IMPRESSION: Small bowel obstruction pattern. The transition point occurs mid small bowel in the lower abdomen without apparent cause. This suggests adhesions.   Electronically Signed   By: ArMaryclare Bean.D.   On: 02/03/2014 13:43    Review of Systems  Constitutional: Negative for fever and chills.  HENT: Negative.   Respiratory: Negative for shortness of breath.   Cardiovascular: Positive for chest pain.  Gastrointestinal: Positive for nausea, vomiting and abdominal pain.   Blood pressure 125/72, pulse 75, temperature 98.8 F (37.1 C), temperature source Oral, resp. rate 18, height _0  (1.803 m), weight 240 lb (108.863 kg), SpO2 95.00%. Physical Exam  Constitutional: He appears well-developed and well-nourished. He appears distressed.  HENT:  Nasogastric tube in place draining bilious fluid.  Eyes: No scleral icterus.  Neck: Neck supple.  Cardiovascular: Normal rate and regular rhythm.   Respiratory: Effort normal and breath sounds normal.  GI: Soft. He exhibits no distension and no mass. There is no tenderness.  Right paramedian scar with no hernia. Hypoactive bowel sounds.  Genitourinary:  No palpable inguinal bulges.  Musculoskeletal: He exhibits no edema.  Lymphadenopathy:    He has no cervical adenopathy.  Neurological: He is alert.  Skin: Skin is warm and dry.  Psychiatric: He has a normal mood and affect. His behavior is  normal.    Assessment/Plan: 1. Small bowel obstruction-no evidence of compromised small bowel.  2. Volume depletion secondary to nausea and vomiting.  Plan: Aggressive rehydration. Recheck labs and x-rays in the morning. Attempt nonoperative management. If  this fails, would need exploratory laparotomy. This was discussed with him.  Jamarcus Laduke J 02/03/2014, 6:07 PM

## 2014-02-03 NOTE — ED Provider Notes (Signed)
TIME SEEN: 12:20 PM  CHIEF COMPLAINT: Abdominal pain, subjective fevers, vomiting  HPI: Patient is a 53 y.o. M with history of prior possible partial colectomy as a teenager who presents to the emergency department with subjective fevers, nausea and vomiting, diffuse abdominal pain that started yesterday. [place] and was found to have a leukocytosis of 18.7 and reportedly an abdominal x-ray that showed a possible bowel obstruction. He was sent to the emergency department for further evaluation. He states that he has had a bowel movement yesterday and it was normal. No bloody stool or melena. He is passing gas. No prior history of small bowel obstruction. No dysuria or hematuria. No testicular pain or swelling. No penile discharge. No sick contacts or recent travel, antibiotic use or hospitalization.  ROS: See HPI Constitutional:  fever  Eyes: no drainage  ENT: no runny nose   Cardiovascular:  no chest pain  Resp: no SOB  GI:  vomiting GU: no dysuria Integumentary: no rash  Allergy: no hives  Musculoskeletal: no leg swelling  Neurological: no slurred speech ROS otherwise negative  PAST MEDICAL HISTORY/PAST SURGICAL HISTORY:  Past Medical History  Diagnosis Date  . Cellulitis of right anterior lower leg     MEDICATIONS:  Prior to Admission medications   Medication Sig Start Date End Date Taking? Authorizing Provider  amoxicillin-clavulanate (AUGMENTIN) 875-125 MG per tablet Take 1 tablet by mouth every 12 (twelve) hours. 10/29/13   Kaitlyn Szekalski, PA-C  clindamycin (CLEOCIN) 300 MG capsule Take 1 capsule (300 mg total) by mouth 4 (four) times daily. X 7 days 01/21/13   Rolan BuccoMelanie Belfi, MD  ketorolac (TORADOL) 10 MG tablet Take 1 tablet (10 mg total) by mouth every 6 (six) hours as needed for pain. 01/20/13   Linna HoffJames D Kindl, MD    ALLERGIES:  No Known Allergies  SOCIAL HISTORY:  History  Substance Use Topics  . Smoking status: Never Smoker   . Smokeless  tobacco: Not on file  . Alcohol Use: No    FAMILY HISTORY: No family history on file.  EXAM: BP 121/93  Pulse 76  Temp(Src) 97.9 F (36.6 C) (Oral)  Resp 16  Ht 5\' 11"  (1.803 m)  Wt 240 lb (108.863 kg)  BMI 33.49 kg/m2  SpO2 100% CONSTITUTIONAL: Alert and oriented and responds appropriately to questions. Well-appearing; well-nourished HEAD: Normocephalic EYES: Conjunctivae clear, PERRL ENT: normal nose; no rhinorrhea; moist mucous membranes; pharynx without lesions noted NECK: Supple, no meningismus, no LAD  CARD: RRR; S1 and S2 appreciated; no murmurs, no clicks, no rubs, no gallops RESP: Normal chest excursion without splinting or tachypnea; breath sounds clear and equal bilaterally; no wheezes, no rhonchi, no rales,  ABD/GI: Normal bowel sounds; non-distended; soft, diffusely tender throughout his abdomen without guarding or rebound, no peritoneal signs, no tympany BACK:  The back appears normal and is non-tender to palpation, there is no CVA tenderness EXT: Normal ROM in all joints; non-tender to palpation; no edema; normal capillary refill; no cyanosis    SKIN: Normal color for age and race; warm NEURO: Moves all extremities equally PSYCH: The patient's mood and manner are appropriate. Grooming and personal hygiene are appropriate.  MEDICAL DECISION MAKING: Patient here with subjective fever, leukocytosis and diffuse abdominal pain. Per urgent care report, patient may have a bowel obstruction but they sent him here for further evaluation and possible CT scan. His exam is not consistent with a bowel structure. He does not have abdominal distention, hyperactive bowel  sounds and he is having normal bowel movements and passing gas. We'll obtain CT scan for further evaluation given he is diffusely tender. We'll repeat abdominal labs, urine. Will give IV fluids, pain and nausea medicine and reassess.  ED PROGRESS: Patient has a small bowel obstruction with a transition point. Likely  secondary to adhesions from his prior abdominal surgery. Will place NG tube, consult surgery but likely admit to medicine at [facility].  PCP is with [facility] physicians.   Spoke with Dr. Carolynne Edouard with surgery at [facility] long to see the patient in consult. Will admit to medicine.   Spoke with Dr. Elisabeth Pigeon with hospitalist service who recommends admission to medical bed, inpatient. Patient and family updated with plan.  Layla Maw Keyleigh Manninen, DO 02/03/14 [phone]

## 2014-02-03 NOTE — ED Notes (Signed)
Reports n/v with abd pain. Not urinating. [place]. Elevated WBC. XR showing ?blockage

## 2014-02-03 NOTE — ED Notes (Signed)
Report called to EwenJustin with [facility]

## 2014-02-04 ENCOUNTER — Inpatient Hospital Stay (HOSPITAL_COMMUNITY): Payer: Managed Care, Other (non HMO)

## 2014-02-04 LAB — URINALYSIS, ROUTINE W REFLEX MICROSCOPIC
Bilirubin Urine: NEGATIVE
Glucose, UA: NEGATIVE mg/dL
Hgb urine dipstick: NEGATIVE
Ketones, ur: NEGATIVE mg/dL
Leukocytes, UA: NEGATIVE
Nitrite: NEGATIVE
Protein, ur: NEGATIVE mg/dL
Specific Gravity, Urine: >1.046 — ABNORMAL HIGH (ref 1.005–1.030)
Urobilinogen, UA: 0.2 mg/dL (ref 0.0–1.0)
pH: 5.5 (ref 5.0–8.0)

## 2014-02-04 LAB — CBC
HCT: 40.4 % (ref 39.0–52.0)
Hemoglobin: 13.4 g/dL (ref 13.0–17.0)
MCH: 30.6 pg (ref 26.0–34.0)
MCHC: 33.2 g/dL (ref 30.0–36.0)
MCV: 92.2 fL (ref 78.0–100.0)
PLATELETS: 146 10*3/uL — AB (ref 150–400)
RBC: 4.38 MIL/uL (ref 4.22–5.81)
RDW: 12.3 % (ref 11.5–15.5)
WBC: 9.1 10*3/uL (ref 4.0–10.5)

## 2014-02-04 LAB — COMPREHENSIVE METABOLIC PANEL
ALT: 15 U/L (ref 0–53)
ANION GAP: 9 (ref 5–15)
AST: 18 U/L (ref 0–37)
Albumin: 3.3 g/dL — ABNORMAL LOW (ref 3.5–5.2)
Alkaline Phosphatase: 49 U/L (ref 39–117)
BUN: 25 mg/dL — AB (ref 6–23)
CALCIUM: 8.2 mg/dL — AB (ref 8.4–10.5)
CO2: 27 meq/L (ref 19–32)
CREATININE: 0.94 mg/dL (ref 0.50–1.35)
Chloride: 105 mEq/L (ref 96–112)
Glucose, Bld: 107 mg/dL — ABNORMAL HIGH (ref 70–99)
Potassium: 4.2 mEq/L (ref 3.7–5.3)
Sodium: 141 mEq/L (ref 137–147)
Total Bilirubin: 1.2 mg/dL (ref 0.3–1.2)
Total Protein: 6.1 g/dL (ref 6.0–8.3)

## 2014-02-04 LAB — TSH: TSH: 0.817 u[IU]/mL (ref 0.350–4.500)

## 2014-02-04 NOTE — Progress Notes (Signed)
[facility] Progress Note     Subjective: Pt doing well, no N/V.  Says abdominal pain and distension is improved.  +flatus, no BM yet.  Ambulating well.  Hoping it will resolve without surgery and he can get his NG out.    Objective: Vital signs in last 24 hours: Temp:  [97.3 F (36.3 C)-98.8 F (37.1 C)] 97.5 F (36.4 C) (07/24 0640) Pulse Rate:  [54-76] 54 (07/24 0640) Resp:  [16-18] 16 (07/24 0640) BP: (106-125)/(50-93) 106/50 mmHg (07/24 0640) SpO2:  [95 %-100 %] 98 % (07/24 0640) Weight:  [240 lb (108.863 kg)] 240 lb (108.863 kg) (07/23 1200) Last BM Date: 02/02/14  Intake/Output from previous day: 07/23 0701 - 07/24 0700 In: 750 [I.V.:750] Out: 950 [Urine:400; Emesis/NG output:550] Intake/Output this shift: Total I/O In: -  Out: 500 [Urine:500]  PE: Gen:  Alert, NAD, pleasant NG tube:  566mL/24 hr Abd: Soft, less distended, NT, +BS, no HSM   Lab Results:   Recent Labs  02/03/14 2006 02/04/14 0500  WBC 13.3* 9.1  HGB 15.4 13.4  HCT 44.1 40.4  PLT 170 146*   BMET  Recent Labs  02/03/14 1215 02/03/14 2006 02/04/14 0500  NA 142  --  141  K 4.3  --  4.2  CL 98  --  105  CO2 25  --  27  GLUCOSE 125*  --  107*  BUN 30*  --  25*  CREATININE 1.20 1.01 0.94  CALCIUM 10.4  --  8.2*   PT/INR No results found for this basename: LABPROT, INR,  in the last 72 hours CMP     Component Value Date/Time   NA 141 02/04/2014 0500   K 4.2 02/04/2014 0500   CL 105 02/04/2014 0500   CO2 27 02/04/2014 0500   GLUCOSE 107* 02/04/2014 0500   BUN 25* 02/04/2014 0500   CREATININE 0.94 02/04/2014 0500   CALCIUM 8.2* 02/04/2014 0500   PROT 6.1 02/04/2014 0500   ALBUMIN 3.3* 02/04/2014 0500   AST 18 02/04/2014 0500   ALT 15 02/04/2014 0500   ALKPHOS 49 02/04/2014 0500   BILITOT 1.2 02/04/2014 0500   GFRNONAA >90 02/04/2014 0500   GFRAA >90 02/04/2014 0500   Lipase     Component Value Date/Time   LIPASE 25 02/03/2014 1215       Studies/Results: Ct Abdomen  Pelvis W Contrast  02/03/2014   CLINICAL DATA:  Abdominal pain  EXAM: CT ABDOMEN AND PELVIS WITH CONTRAST  TECHNIQUE: Multidetector CT imaging of the abdomen and pelvis was performed using the standard protocol following bolus administration of intravenous contrast.  CONTRAST:  50mL OMNIPAQUE IOHEXOL 300 MG/ML SOLN, OMNIPAQUE IOHEXOL 300 MG/ML SOLN  COMPARISON:  None.  FINDINGS: Distended proximal small bowel loops and decompressed distal small bowel loops are noted. Fecalized material within mid mid small bowel in the lower abdomen on image 68 is present just proximal to the transition point. No obvious apparent cause of obstruction. Colon is decompressed. Air-fluid levels in distended small bowel are noted. No free intraperitoneal gas. No pneumatosis. Stomach is distended.  Diffuse hepatic steatosis.  Gallbladder, spleen, pancreas, adrenal glands, and kidneys are within normal limits.  Bladder and prostate are unremarkable.  No free-fluid.  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  IMPRESSION: Small bowel obstruction pattern. The transition point occurs mid small bowel in the lower abdomen without apparent cause. This suggests adhesions.   Electronically Signed   By:  Art  Hoss M.D.   On: 02/03/2014 13:43    Anti-infectives: Anti-infectives   None       Assessment/Plan SBO - no evidence of compromised small bowel, improving Volume depletion Nausea and vomiting   Plan:  1.  IVF, pain control, antiemetics, NPO, NG tube 2.  Continue conservative management in hopes will will not have to intervene surgically.  If she does not improve may need Ex Lap. 3.  Ambulate and IS 4.  SCD's and heparin 5.  Hopefully clamping trials tomorrow if doing well 6.  Repeat KUB today 7.  Will take on our service, since he's a straight forward SBO who is resolving without significant medical problems.    LOS: 1 day    Aris GeorgiaDORT, Allsion Nogales 02/04/2014, 9:19 AM Pager:  [phone]

## 2014-02-04 NOTE — Progress Notes (Signed)
[facility] PROGRESS NOTE  Bettey MareJames H Segar ZOX:[id number] DOB: 12/16/1960 DOA: 02/03/2014 PCP: Thora LanceEHINGER,ROBERT R, MD  Assessment/Plan: Principal Problem:   Small bowel obstruction - General surgery on board - NG tube in place - suspect 2ary to adhesions given history of Colon surgery in the past. - On MIVF's - Discussed with General surgery and plan will be for General surgery to take over    Code Status: full Family Communication: None at bedside Disposition Plan: Per General surgery   Procedures:  None  Antibiotics:  None  HPI/Subjective: Patient has no new complaints. Reports passing gas  Objective: Filed Vitals:   02/04/14 1000  BP: 109/68  Pulse: 63  Temp: 98.9 F (37.2 C)  Resp: 18    Intake/Output Summary (Last 24 hours) at 02/04/14 1355 Last data filed at 02/04/14 1000  Gross per 24 hour  Intake    750 ml  Output   1450 ml  Net   -700 ml   Filed Weights   02/03/14 1200  Weight: 108.863 kg (240 lb)    Exam:   General:  Pt in nad, alert and awake  Cardiovascular: rrr, no mrg  Respiratory: cta bl, no wheezes  Abdomen: soft, ND  Musculoskeletal: no cyanosis or clubbing   Data Reviewed: Basic Metabolic Panel:  Recent Labs Lab 02/03/14 1215 02/03/14 2006 02/04/14 0500  NA 142  --  141  K 4.3  --  4.2  CL 98  --  105  CO2 25  --  27  GLUCOSE 125*  --  107*  BUN 30*  --  25*  CREATININE 1.20 1.01 0.94  CALCIUM 10.4  --  8.2*   Liver Function Tests:  Recent Labs Lab 02/03/14 1215 02/04/14 0500  AST 24 18  ALT 20 15  ALKPHOS 72 49  BILITOT 2.1* 1.2  PROT 8.9* 6.1  ALBUMIN 5.0 3.3*    Recent Labs Lab 02/03/14 1215  LIPASE 25   No results found for this basename: AMMONIA,  in the last 168 hours CBC:  Recent Labs Lab 02/03/14 1215 02/03/14 2006 02/04/14 0500  WBC 17.3* 13.3* 9.1  NEUTROABS 14.5*  --   --   HGB 17.4* 15.4 13.4  HCT 48.9 44.1 40.4  MCV 89.1 91.7 92.2  PLT 235 170 146*   Cardiac  Enzymes: No results found for this basename: CKTOTAL, CKMB, CKMBINDEX, TROPONINI,  in the last 168 hours BNP (last 3 results) No results found for this basename: PROBNP,  in the last 8760 hours CBG: No results found for this basename: GLUCAP,  in the last 168 hours  No results found for this or any previous visit (from the past 240 hour(s)).   Studies: Ct Abdomen Pelvis W Contrast  02/03/2014   CLINICAL DATA:  Abdominal pain  EXAM: CT ABDOMEN AND PELVIS WITH CONTRAST  TECHNIQUE: Multidetector CT imaging of the abdomen and pelvis was performed using the standard protocol following bolus administration of intravenous contrast.  CONTRAST:  50mL OMNIPAQUE IOHEXOL 300 MG/ML SOLN, 100mL OMNIPAQUE IOHEXOL 300 MG/ML SOLN  COMPARISON:  None.  FINDINGS: Distended proximal small bowel loops and decompressed distal small bowel loops are noted. Fecalized material within mid mid small bowel in the lower abdomen on image 68 is present just proximal to the transition point. No obvious apparent cause of obstruction. Colon is decompressed. Air-fluid levels in distended small bowel are noted. No free intraperitoneal gas. No pneumatosis. Stomach is distended.  Diffuse hepatic steatosis.  Gallbladder, spleen, pancreas, adrenal glands,  and kidneys are within normal limits.  Bladder and prostate are unremarkable.  No free-fluid.  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  IMPRESSION: Small bowel obstruction pattern. The transition point occurs mid small bowel in the lower abdomen without apparent cause. This suggests adhesions.   Electronically Signed   By: Maryclare Bean M.D.   On: 02/03/2014 13:43   Dg Abd 2 Views  02/04/2014   CLINICAL DATA:  Abdominal pain.  EXAM: ABDOMEN - 2 VIEW  COMPARISON:  CT, 02/03/2014  FINDINGS: Small bowel dilation noted in the left upper quadrant with associated air-fluid levels. This is similar to the prior CT. The colon is mostly decompressed, mostly  filled with residual contrast. No free air.  Soft tissues are otherwise unremarkable.  There is a tube, which may reflect a nasogastric tube, which projects the distal esophagus, at least 12 cm above the gastroesophageal junction.  IMPRESSION: 1. Persistent partial small bowel obstruction.  No free air. 2. Apparent nasogastric tube with its tip in the distal esophagus. This needs further inserted, approximate 20 cm, to allow the tip to fully into the stomach.   Electronically Signed   By: Amie Portland M.D.   On: 02/04/2014 11:39    Scheduled Meds: . heparin  5,000 Units Subcutaneous 3 times per day  . pantoprazole (PROTONIX) IV  40 mg Intravenous Q24H   Continuous Infusions: . dextrose 5 % and 0.9% NaCl 150 mL/hr at 02/03/14 1823  . dextrose 5 % and 0.9% NaCl 75 mL/hr (02/03/14 2339)    Time spent: > 35 minutes    Penny Pia  [facility] Pager [phone]. If 7PM-7AM, please contact night-coverage at [email or web address], password [facility] 02/04/2014, 1:55 PM  LOS: 1 day

## 2014-02-04 NOTE — Progress Notes (Signed)
Seen and agree  

## 2014-02-05 MED ORDER — KCL IN DEXTROSE-NACL 20-5-0.45 MEQ/L-%-% IV SOLN
INTRAVENOUS | Status: DC
  Administered 2014-02-05 – 2014-02-06 (×2): via INTRAVENOUS
  Filled 2014-02-05 (×5): qty 1000

## 2014-02-05 MED ORDER — BISACODYL 10 MG RE SUPP
10.0000 mg | Freq: Once | RECTAL | Status: AC
  Administered 2014-02-05: 10 mg via RECTAL
  Filled 2014-02-05: qty 1

## 2014-02-05 NOTE — Progress Notes (Signed)
Patient ID: George Ruiz, male   DOB: 08/27/1960, 53 y.o.   MRN: [id number]  General Surgery - [facility] - Progress Note  Subjective: Patient without complaint - mild abd pain.  No nausea or emesis.  Passing flatus, no BM's yet.  Ambulatory.  Objective: Vital signs in last 24 hours: Temp:  [97.7 F (36.5 C)-98.1 F (36.7 C)] 97.7 F (36.5 C) (07/25 0532) Pulse Rate:  [34-53] 46 (07/25 0532) Resp:  [18] 18 (07/25 0532) BP: (119-130)/(68-71) 130/71 mmHg (07/25 0532) SpO2:  [98 %-100 %] 98 % (07/25 0532) Last BM Date: 02/02/14  Intake/Output from previous day: 07/24 0701 - 07/25 0700 In: 1971.3 [I.V.:1971.3] Out: 1150 [Urine:850; Emesis/NG output:300]  Exam: HEENT - clear, not icteric Neck - soft Chest - clear bilaterally Cor - RRR, no murmur Abd - soft without distension; BS present; minimal tenderness Ext - no significant edema Neuro - grossly intact, no focal deficits  Lab Results:   Recent Labs  02/03/14 2006 02/04/14 0500  WBC 13.3* 9.1  HGB 15.4 13.4  HCT 44.1 40.4  PLT 170 146*     Recent Labs  02/03/14 1215 02/03/14 2006 02/04/14 0500  NA 142  --  141  K 4.3  --  4.2  CL 98  --  105  CO2 25  --  27  GLUCOSE 125*  --  107*  BUN 30*  --  25*  CREATININE 1.20 1.01 0.94  CALCIUM 10.4  --  8.2*    Studies/Results: Ct Abdomen Pelvis W Contrast  02/03/2014   CLINICAL DATA:  Abdominal pain  EXAM: CT ABDOMEN AND PELVIS WITH CONTRAST  TECHNIQUE: Multidetector CT imaging of the abdomen and pelvis was performed using the standard protocol following bolus administration of intravenous contrast.  CONTRAST:  50mL OMNIPAQUE IOHEXOL 300 MG/ML SOLN, OMNIPAQUE IOHEXOL 300 MG/ML SOLN  COMPARISON:  None.  FINDINGS: Distended proximal small bowel loops and decompressed distal small bowel loops are noted. Fecalized material within mid mid small bowel in the lower abdomen on image 68 is present just proximal to the transition point. No obvious  apparent cause of obstruction. Colon is decompressed. Air-fluid levels in distended small bowel are noted. No free intraperitoneal gas. No pneumatosis. Stomach is distended.  Diffuse hepatic steatosis.  Gallbladder, spleen, pancreas, adrenal glands, and kidneys are within normal limits.  Bladder and prostate are unremarkable.  No free-fluid.  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  IMPRESSION: Small bowel obstruction pattern. The transition point occurs mid small bowel in the lower abdomen without apparent cause. This suggests adhesions.   Electronically Signed   By: Maryclare Bean M.D.   On: 02/03/2014 13:43   Dg Abd 2 Views  02/04/2014   CLINICAL DATA:  Abdominal pain.  EXAM: ABDOMEN - 2 VIEW  COMPARISON:  CT, 02/03/2014  FINDINGS: Small bowel dilation noted in the left upper quadrant with associated air-fluid levels. This is similar to the prior CT. The colon is mostly decompressed, mostly filled with residual contrast. No free air.  Soft tissues are otherwise unremarkable.  There is a tube, which may reflect a nasogastric tube, which projects the distal esophagus, at least 12 cm above the gastroesophageal junction.  IMPRESSION: 1. Persistent partial small bowel obstruction.  No free air. 2. Apparent nasogastric tube with its tip in the distal esophagus. This needs further inserted, approximate 20 cm, to allow the tip to fully into the stomach.   Electronically  Signed   By: Amie Portlandavid  Ormond M.D.   On: 02/04/2014 11:39    Assessment / Plan: 1.  Small bowel obstruction  Clinically improving  Clamp NG tube  Change IVF  Dulcolax supp at patient request  Ambulate in halls  Velora Hecklerodd M. Garritt Molyneux, MD, [facility] Office: [phone]  02/05/2014

## 2014-02-06 MED ORDER — ZOLPIDEM TARTRATE 5 MG PO TABS
5.0000 mg | ORAL_TABLET | Freq: Every evening | ORAL | Status: DC | PRN
  Administered 2014-02-06: 5 mg via ORAL
  Filled 2014-02-06: qty 1

## 2014-02-06 NOTE — Progress Notes (Signed)
Patient ID: George Ruiz, male   DOB: 04/02/1961, 53 y.o.   MRN: [id number]  General [facility] - [facility] - Progress Note  Subjective: Patient without complaints - some sinus drainage.  No nausea or emesis.  Tolerating clear liquids with NG clamped overnight.  Two BM's.  Objective: Vital signs in last 24 hours: Temp:  [98.3 F (36.8 C)-99.4 F (37.4 C)] 98.7 F (37.1 C) (07/26 0522) Pulse Rate:  [52-70] 70 (07/26 0522) Resp:  [18] 18 (07/26 0522) BP: (122-138)/(66-93) 138/93 mmHg (07/26 0522) SpO2:  [98 %-99 %] 98 % (07/26 0522) Last BM Date: 02/05/14  Intake/Output from previous day: 07/25 0701 - 07/26 0700 In: 1781.7 [P.O.:60; I.V.:1721.7] Out: 1153 [Urine:1002; Emesis/NG output:150; Stool:1]  Exam: HEENT - clear, not icteric Neck - soft Chest - clear bilaterally Cor - RRR, no murmur Abd - soft without distension; non-tender; BS present Ext - no significant edema Neuro - grossly intact, no focal deficits  Lab Results:   Recent Labs  02/03/14 2006 02/04/14 0500  WBC 13.3* 9.1  HGB 15.4 13.4  HCT 44.1 40.4  PLT 170 146*     Recent Labs  02/03/14 1215 02/03/14 2006 02/04/14 0500  NA 142  --  141  K 4.3  --  4.2  CL 98  --  105  CO2 25  --  27  GLUCOSE 125*  --  107*  BUN 30*  --  25*  CREATININE 1.20 1.01 0.94  CALCIUM 10.4  --  8.2*    Studies/Results: Dg Abd 2 Views  02/04/2014   CLINICAL DATA:  Abdominal pain.  EXAM: ABDOMEN - 2 VIEW  COMPARISON:  CT, 02/03/2014  FINDINGS: Small bowel dilation noted in the left upper quadrant with associated air-fluid levels. This is similar to the prior CT. The colon is mostly decompressed, mostly filled with residual contrast. No free air.  Soft tissues are otherwise unremarkable.  There is a tube, which may reflect a nasogastric tube, which projects the distal esophagus, at least 12 cm above the gastroesophageal junction.  IMPRESSION: 1. Persistent partial small bowel obstruction.  No free air.  2. Apparent nasogastric tube with its tip in the distal esophagus. This needs further inserted, approximate 20 cm, to allow the tip to fully into the stomach.   Electronically Signed   By: Amie Portlandavid  Ormond M.D.   On: 02/04/2014 11:39    Assessment / Plan: 1. Small bowel obstruction   Clinically improving   Remove NG tube   Decrease IVF rate  Ambulate in halls  Full liquid diet  Velora Hecklerodd M. Gwynn Chalker, MD, [facility] Office: [phone]  02/06/2014

## 2014-02-07 NOTE — Discharge Instructions (Signed)
Small Bowel Obstruction A small bowel obstruction is a blockage (obstruction) of the small intestine (small bowel). The small bowel is a long, slender tube that connects the stomach to the colon. Its job is to absorb nutrients from the fluids and foods you consume into the bloodstream.  CAUSES  There are many causes of intestinal blockage. The most common ones include:  Hernias. This is a more common cause in children than adults.  Inflammatory bowel disease (enteritis and colitis).  Twisting of the bowel (volvulus).  Tumors.  Scar tissue (adhesions) from previous surgery or radiation treatment.  Recent surgery. This may cause an acute small bowel obstruction called an ileus. SYMPTOMS   Abdominal pain. This may be dull cramps or sharp pain. It may occur in one area or may be present in the entire abdomen. Pain can range from mild to severe, depending on the degree of obstruction.  Nausea and vomiting. Vomit may be greenish or yellow bile color.  Distended or swollen stomach. Abdominal bloating is a common symptom.  Constipation.  Lack of passing gas.  Frequent belching.  Diarrhea. This may occur if runny stool is able to leak around the obstruction. DIAGNOSIS  Your caregiver can usually diagnose small bowel obstruction by taking a history, doing a physical exam, and taking X-rays. If the cause is unclear, a CT scan (computerized tomography) of your abdomen and pelvis may be needed. TREATMENT  Treatment of the blockage depends on the cause and how bad the problem is.   Sometimes, the obstruction improves with bed rest and intravenous (IV) fluids.  Resting the bowel is very important. This means following a simple diet. Sometimes, a clear liquid diet may be required for several days.  Sometimes, a small tube (nasogastric tube) is placed into the stomach to decompress the bowel. When the bowel is blocked, it usually swells up like a balloon filled with air and fluids.  Decompression means that the air and fluids are removed by suction through that tube. This can help with pain, discomfort, and nausea. It can also help the obstruction resolve faster.  Surgery may be required if other treatments do not work. Bowel obstruction from a hernia may require early surgery and can be an emergency procedure. Adhesions that cause frequent or severe obstructions may also require surgery. HOME CARE INSTRUCTIONS If your bowel obstruction is only partial or incomplete, you may be allowed to go home.  Get plenty of rest.  Follow your diet as directed by your caregiver.  Only consume clear liquids until your condition improves.  Avoid solid foods as instructed. SEEK IMMEDIATE MEDICAL CARE IF:  You have increased pain or cramping.  You vomit blood.  You have uncontrolled vomiting or nausea.  You cannot drink fluids due to vomiting or pain.  You develop confusion.  You begin feeling very dry or thirsty (dehydrated).  You have severe bloating.  You have chills.  You have a fever.  You feel extremely weak or you faint. MAKE SURE YOU:  Understand these instructions.  Will watch your condition.  Will get help right away if you are not doing well or get worse. Document Released: 09/17/2005 Document Revised: 09/23/2011 Document Reviewed: 09/14/2010 [facility] Patient Information 2015 [place], [place]. This information is not intended to replace advice given to you by your health care provider. Make sure you discuss any questions you have with your health care provider.  High-Fiber Diet (after 1 week resume high fiber diet) Fiber is found in fruits, vegetables, and grains.  A high-fiber diet encourages the addition of more whole grains, legumes, fruits, and vegetables in your diet. The recommended amount of fiber for adult males is 38 g per day. For adult females, it is 25 g per day. Pregnant and lactating women should get 28 g of fiber per day. If you have a  digestive or bowel problem, ask your caregiver for advice before adding high-fiber foods to your diet. Eat a variety of high-fiber foods instead of only a select few type of foods.  PURPOSE  To increase stool bulk.  To make bowel movements more regular to prevent constipation.  To lower cholesterol.  To prevent overeating. WHEN IS THIS DIET USED?  It may be used if you have constipation and hemorrhoids.  It may be used if you have uncomplicated diverticulosis (intestine condition) and irritable bowel syndrome.  It may be used if you need help with weight management.  It may be used if you want to add it to your diet as a protective measure against atherosclerosis, diabetes, and cancer. SOURCES OF FIBER  Whole-grain breads and cereals.  Fruits, such as apples, oranges, bananas, berries, prunes, and pears.  Vegetables, such as green peas, carrots, sweet potatoes, beets, broccoli, cabbage, spinach, and artichokes.  Legumes, such split peas, soy, lentils.  Almonds. FIBER CONTENT IN FOODS Starches and Grains / Dietary Fiber (g)  Cheerios, 1 cup / 3 g  Corn Flakes cereal, 1 cup / 0.7 g  Rice crispy treat cereal, 1 cup / 0.3 g  Instant oatmeal (cooked),  cup / 2 g  Frosted wheat cereal, 1 cup / 5.1 g  Brown, long-grain rice (cooked), 1 cup / 3.5 g  White, long-grain rice (cooked), 1 cup / 0.6 g  Enriched macaroni (cooked), 1 cup / 2.5 g Legumes / Dietary Fiber (g)  Baked beans (canned, plain, or vegetarian),  cup / 5.2 g  Kidney beans (canned),  cup / 6.8 g  Pinto beans (cooked),  cup / 5.5 g Breads and Crackers / Dietary Fiber (g)  Plain or honey graham crackers, 2 squares / 0.7 g  Saltine crackers, 3 squares / 0.3 g  Plain, salted pretzels, 10 pieces / 1.8 g  Whole-wheat bread, 1 slice / 1.9 g  White bread, 1 slice / 0.7 g  Raisin bread, 1 slice / 1.2 g  Plain bagel, 3 oz / 2 g  Flour tortilla, 1 oz / 0.9 g  Corn tortilla, 1 small / 1.5  g  Hamburger or hotdog bun, 1 small / 0.9 g Fruits / Dietary Fiber (g)  Apple with skin, 1 medium / 4.4 g  Sweetened applesauce,  cup / 1.5 g  Banana,  medium / 1.5 g  Grapes, 10 grapes / 0.4 g  Orange, 1 small / 2.3 g  Raisin, 1.5 oz / 1.6 g  Melon, 1 cup / 1.4 g Vegetables / Dietary Fiber (g)  Green beans (canned),  cup / 1.3 g  Carrots (cooked),  cup / 2.3 g  Broccoli (cooked),  cup / 2.8 g  Peas (cooked),  cup / 4.4 g  Mashed potatoes,  cup / 1.6 g  Lettuce, 1 cup / 0.5 g  Corn (canned),  cup / 1.6 g  Tomato,  cup / 1.1 g Document Released: 07/01/2005 Document Revised: 12/31/2011 Document Reviewed: 10/03/2011 [employer] Patient Information 2015 [place]. This information is not intended to replace advice given to you by your health care provider. Make sure you discuss any questions you have with  your health care provider. ° °

## 2014-02-07 NOTE — Discharge Summary (Signed)
Seen, agree with above.  Resolved SBO.  Home today.

## 2014-02-07 NOTE — Discharge Summary (Signed)
  [facility] Discharge Summary   Patient ID: Bettey MareJames H Starzyk MRN: [id number] DOB/AGE: 54/03/1961 54 y.o.  Admit date: 02/03/2014 Discharge date: 02/07/2014  Admitting Diagnosis: SBO secondary to adhesions  Discharge Diagnosis Patient Active Problem List   Diagnosis Date Noted  . Small bowel obstruction 02/03/2014  . SBO (small bowel obstruction) 02/03/2014    Consultants None  Imaging: No results found.  Procedures None  Hospital Course:  53 y/o white male was in his normal state of health until Wednesday afternoon when he began developing some cramping abdominal pain and distention. He subsequently vomited 4 times-the contents of what he had to eat that day. He took over-the-counter medication for nausea I was able to sleep through the night. This morning he woke up very first day and drank about 30 ounces of Powerade and had a small meal.  He subsequently threw that up. [facility]. CT scan suggested a small bowel obstruction with a transition point. [facility]. Last bowel movement was yesterday. No hematemesis.  Patient was admitted and transferred to the floor for conservative management of his bowel obstruction.  Luckily it started to resolve without surgical intervention and his NG tube was discontinued and diet was advanced as tolerated.  On HD #4, the patient was voiding well, tolerating diet, ambulating well, pain well controlled, vital signs stable, and felt stable for discharge home.  Patient will follow up in our office as needed and knows to call with questions or concerns.  He can follow up with his PCP for a post-hospital evaluation/preventative medicine check as well.    Physical Exam: General:  Alert, NAD, pleasant, comfortable Abd:  Soft, ND, mild tenderness, well healed paramidline scar    Medication List         acetaminophen 325 MG tablet  Commonly known as:  TYLENOL  Take  650 mg by mouth every 6 (six) hours as needed (pain).             Follow-up Information   Call [facility]. (As needed, but no specific follow up needed)    Contact information:   [address] [place] [address] [phone]      Schedule an appointment as soon as possible for a visit with Thora LanceEHINGER,ROBERT R, MD. (As needed for post hospital follow up)    Specialty:  Family Medicine   Contact information:   [address] [place] [address] [phone]       Signed: Candiss NorseMegan Dort, PA-C [facility] [phone]  02/07/2014, 9:19 AM

## 2014-02-07 NOTE — Progress Notes (Signed)
Pt d/c to home. Discharge paperwork, reasons to return to ED/MD, follow-up appts, and prescriptions reviewed with pt. Pt offered no questions/concerns regarding d/c. PIV removed. RN and pt signed paperwork. Pt escorted off of unit in wheelchair to care of family member when ready.

## 2016-11-12 ENCOUNTER — Other Ambulatory Visit: Payer: Self-pay | Admitting: Family Medicine

## 2016-11-12 ENCOUNTER — Ambulatory Visit
Admission: RE | Admit: 2016-11-12 | Discharge: 2016-11-12 | Disposition: A | Discharge status: Home / Self Care | Payer: Managed Care, Other (non HMO) | Source: Ambulatory Visit | Attending: Family Medicine | Admitting: Family Medicine

## 2016-11-12 DIAGNOSIS — M79672 Pain in left foot: Secondary | ICD-10-CM

## 2017-07-16 ENCOUNTER — Ambulatory Visit (INDEPENDENT_AMBULATORY_CARE_PROVIDER_SITE_OTHER): Payer: Managed Care, Other (non HMO) | Admitting: Family Medicine

## 2017-07-16 ENCOUNTER — Encounter: Payer: Self-pay | Admitting: Family Medicine

## 2017-07-16 DIAGNOSIS — S8991XA Unspecified injury of right lower leg, initial encounter: Secondary | ICD-10-CM

## 2017-07-16 NOTE — Patient Instructions (Signed)
I'm concerned you tore your lateral meniscus (vs. Meniscus contusion with synovitis). Both are treated similarly. Ice the knee 15 minutes at a time 3-4 times a day. Aleve 2 tabs twice a day with food OR ibuprofen 600mg  three times a day with food for pain and inflammation. Straight leg raises and knee extensions 3 sets of 10 once a day - add ankle weight if these become too easy. Sleeve or brace are ok for support but not necessary. Follow up with me in 1 1/2 weeks - see work note for details in meantime. Avoid and twisting maneuvers, squatting in the meantime also.

## 2017-07-17 ENCOUNTER — Encounter: Payer: Self-pay | Admitting: Family Medicine

## 2017-07-17 DIAGNOSIS — S8991XD Unspecified injury of right lower leg, subsequent encounter: Secondary | ICD-10-CM | POA: Insufficient documentation

## 2017-07-17 NOTE — Progress Notes (Signed)
PCP: Blair HeysEhinger, Robert, MD  Subjective:   HPI: Patient is a 57 y.o. male here for right knee injury.  Patient reports on 12/30 he was walking in his neighborhood. No acute injury but when doing so he felt and heard a loud pop deep in right knee more laterally. Pain severe initially. No swelling (maybe some initially). Did not twist or hyperextend. Pain down to 4/10 now and more dull. Has been elevating, using heat, taking ibuprofen and naproxen. Pain radiates from anterolateral knee to posterior. No skin changes, numbness.  Past Medical History:  Diagnosis Date  . Cellulitis of right anterior lower leg     Current Outpatient Medications on File Prior to Visit  Medication Sig Dispense Refill  . aspirin EC 81 MG tablet Take by mouth.    Marland Kitchen. acetaminophen (TYLENOL) 325 MG tablet Take 650 mg by mouth every 6 (six) hours as needed (pain).     No current facility-administered medications on file prior to visit.     Past Surgical History:  Procedure Laterality Date  . COLON SURGERY      No Known Allergies  Social History   Socioeconomic History  . Marital status: Married    Spouse name: Not on file  . Number of children: Not on file  . Years of education: Not on file  . Highest education level: Not on file  Social Needs  . Financial resource strain: Not on file  . Food insecurity - worry: Not on file  . Food insecurity - inability: Not on file  . Transportation needs - medical: Not on file  . Transportation needs - non-medical: Not on file  Occupational History  . Not on file  Tobacco Use  . Smoking status: Never Smoker  . Smokeless tobacco: Never Used  Substance and Sexual Activity  . Alcohol use: No  . Drug use: No  . Sexual activity: Not on file  Other Topics Concern  . Not on file  Social History Narrative  . Not on file    History reviewed. No pertinent family history.  BP (!) 146/84   Pulse 77   Ht 5\' 11"  (1.803 m)   Wt 246 lb (111.6 kg)   BMI 34.31  kg/m   Review of Systems: See HPI above.     Objective:  Physical Exam:  Gen: NAD, comfortable in exam room  Right knee: Mild effusion. TTP lateral joint line.  No other tenderness. FROM with 5/5 strength. Negative ant/post drawers. Negative valgus/varus testing. Negative lachmanns. Positive thessalys, mcmurrays, apleys.  Negative patellar apprehension. NV intact distally.  Left knee: No gross deformity, ecchymoses, swelling. No TTP. FROM with 5/5 strength. Negative ant/post drawers. Negative valgus/varus testing. Negative lachmanns. NV intact distally.   Assessment & Plan:  1. Right knee injury - concerning for lateral meniscus tear based on exam.  Possible just contusion with synovitis - both treated similarly.  Icing, aleve or ibuprofen.  Reviewed home exercises to do daily.  Sleeve or brace only if he feels this gives him more support.  F/u in 1 1/2 weeks.  Light duty if available.

## 2017-07-17 NOTE — Assessment & Plan Note (Signed)
concerning for lateral meniscus tear based on exam.  Possible just contusion with synovitis - both treated similarly.  Icing, aleve or ibuprofen.  Reviewed home exercises to do daily.  Sleeve or brace only if he feels this gives him more support.  F/u in 1 1/2 weeks.  Light duty if available.

## 2017-07-28 ENCOUNTER — Ambulatory Visit: Payer: Self-pay | Admitting: Family Medicine

## 2017-07-28 ENCOUNTER — Ambulatory Visit (INDEPENDENT_AMBULATORY_CARE_PROVIDER_SITE_OTHER): Payer: Managed Care, Other (non HMO) | Admitting: Family Medicine

## 2017-07-28 ENCOUNTER — Encounter: Payer: Self-pay | Admitting: Family Medicine

## 2017-07-28 DIAGNOSIS — S8991XD Unspecified injury of right lower leg, subsequent encounter: Secondary | ICD-10-CM

## 2017-07-28 NOTE — Patient Instructions (Signed)
You're doing much better than expected at this point. Do home exercises ~3 times a week for 4 more weeks then you can be done with these. No limitations on activities at this point. Follow up with me as needed.

## 2017-07-29 ENCOUNTER — Encounter: Payer: Self-pay | Admitting: Family Medicine

## 2017-07-29 NOTE — Assessment & Plan Note (Signed)
consistent with lateral meniscus tear.  Clinically much improved.  Icing, aleve or ibuprofen if needed.  Continue home exercises for 4 more weeks.  F/u prn.

## 2017-07-29 NOTE — Progress Notes (Signed)
PCP: Blair HeysEhinger, Robert, MD  Subjective:   HPI: Patient is a 57 y.o. male here for right knee injury.  1/2: Patient reports on 12/30 he was walking in his neighborhood. No acute injury but when doing so he felt and heard a loud pop deep in right knee more laterally. Pain severe initially. No swelling (maybe some initially). Did not twist or hyperextend. Pain down to 4/10 now and more dull. Has been elevating, using heat, taking ibuprofen and naproxen. Pain radiates from anterolateral knee to posterior. No skin changes, numbness.  1/14: Patient reports he feels about 95% improved  Doing home exercises. Walking without a problem around neighborhood. Using heating pad. No swelling. No catching, locking, giving out. Not taking any medicines for this. No skin changes, numbness.  Past Medical History:  Diagnosis Date  . Cellulitis of right anterior lower leg     Current Outpatient Medications on File Prior to Visit  Medication Sig Dispense Refill  . acetaminophen (TYLENOL) 325 MG tablet Take 650 mg by mouth every 6 (six) hours as needed (pain).    Marland Kitchen. aspirin EC 81 MG tablet Take by mouth.     No current facility-administered medications on file prior to visit.     Past Surgical History:  Procedure Laterality Date  . COLON SURGERY      No Known Allergies  Social History   Socioeconomic History  . Marital status: Married    Spouse name: Not on file  . Number of children: Not on file  . Years of education: Not on file  . Highest education level: Not on file  Social Needs  . Financial resource strain: Not on file  . Food insecurity - worry: Not on file  . Food insecurity - inability: Not on file  . Transportation needs - medical: Not on file  . Transportation needs - non-medical: Not on file  Occupational History  . Not on file  Tobacco Use  . Smoking status: Never Smoker  . Smokeless tobacco: Never Used  Substance and Sexual Activity  . Alcohol use: No  . Drug  use: No  . Sexual activity: Not on file  Other Topics Concern  . Not on file  Social History Narrative  . Not on file    History reviewed. No pertinent family history.  BP (!) 135/93   Pulse (!) 109   Ht 5\' 11"  (1.803 m)   Wt 246 lb (111.6 kg)   BMI 34.31 kg/m   Review of Systems: See HPI above.     Objective:  Physical Exam:  Gen: NAD, comfortable in exam room.  Right knee: No gross deformity, ecchymoses, swelling. Mild tenderness lateral joint line.  No other tenderness. FROM with 5/5 strength. Negative ant/post drawers. Negative valgus/varus testing. Negative lachmanns. Negative mcmurrays, apleys, patellar apprehension. NV intact distally.   Assessment & Plan:  1. Right knee injury - consistent with lateral meniscus tear.  Clinically much improved.  Icing, aleve or ibuprofen if needed.  Continue home exercises for 4 more weeks.  F/u prn.

## 2018-08-03 ENCOUNTER — Ambulatory Visit: Payer: Self-pay | Admitting: Cardiology

## 2018-09-07 ENCOUNTER — Ambulatory Visit
Admission: RE | Admit: 2018-09-07 | Discharge: 2018-09-07 | Disposition: A | Discharge status: Home / Self Care | Payer: Managed Care, Other (non HMO) | Source: Ambulatory Visit | Attending: Family Medicine | Admitting: Family Medicine

## 2018-09-07 ENCOUNTER — Other Ambulatory Visit: Payer: Self-pay | Admitting: Family Medicine

## 2018-09-07 DIAGNOSIS — M7989 Other specified soft tissue disorders: Secondary | ICD-10-CM

## 2019-01-13 ENCOUNTER — Emergency Department (HOSPITAL_BASED_OUTPATIENT_CLINIC_OR_DEPARTMENT_OTHER): Payer: Managed Care, Other (non HMO)

## 2019-01-13 ENCOUNTER — Other Ambulatory Visit: Payer: Self-pay

## 2019-01-13 ENCOUNTER — Encounter (HOSPITAL_BASED_OUTPATIENT_CLINIC_OR_DEPARTMENT_OTHER): Payer: Self-pay | Admitting: Emergency Medicine

## 2019-01-13 ENCOUNTER — Emergency Department (HOSPITAL_BASED_OUTPATIENT_CLINIC_OR_DEPARTMENT_OTHER)
Admission: EM | Admit: 2019-01-13 | Discharge: 2019-01-13 | Disposition: A | Discharge status: Home / Self Care | Payer: Managed Care, Other (non HMO) | Attending: Emergency Medicine | Admitting: Emergency Medicine

## 2019-01-13 DIAGNOSIS — M546 Pain in thoracic spine: Secondary | ICD-10-CM | POA: Diagnosis present

## 2019-01-13 DIAGNOSIS — M549 Dorsalgia, unspecified: Secondary | ICD-10-CM

## 2019-01-13 DIAGNOSIS — M62830 Muscle spasm of back: Secondary | ICD-10-CM | POA: Diagnosis not present

## 2019-01-13 MED ORDER — CYCLOBENZAPRINE HCL 10 MG PO TABS: 10.0000 mg | ORAL_TABLET | Freq: Two times a day (BID) | ORAL | 0 refills | Status: AC | PRN

## 2019-01-13 MED ORDER — LIDOCAINE 5 % EX PTCH: 1.0000 | MEDICATED_PATCH | CUTANEOUS | 0 refills | Status: DC

## 2019-01-13 NOTE — ED Triage Notes (Signed)
Pain in right upper back for about a week with no known injury.  Pt sts that for about a month he has been having episodes of tingling and then numbness in both arms but worse in right.

## 2019-01-13 NOTE — Discharge Instructions (Addendum)
You were evaluated in the Emergency Department and after careful evaluation, we did not find any emergent condition requiring admission or further testing in the hospital.  Your chest x-ray was normal.  Your symptoms today seem to be due to strain or spasm of the muscles of the back.  We encourage you to continue using Tylenol or ibuprofen/Naprosyn at home for discomfort.  We have also provided with lidocaine patches, which can be applied to the most painful site for relief.  You can also use the Flexeril muscle relaxer medication, which is best used at night if you are having trouble sleeping due to the pain.  Do not mix the Flexeril with alcohol as it can cause drowsiness.  Please return to the Emergency Department if you experience any worsening of your condition.  We encourage you to follow up with a primary care provider.  Thank you for allowing [place] to be a part of your care.

## 2019-01-13 NOTE — ED Provider Notes (Addendum)
[facility] Emergency Department Provider Note MRN:  [id number]  Arrival date & time: 01/13/19     Chief Complaint   Back Pain   History of Present Illness   George Ruiz is a 58 y.o. year-old male with no pertinent past medical history presenting to the ED with chief complaint of back pain.  1 week of persistent back pain, gradual onset, located in the thoracic back on the right side just below the shoulder blade.  Worse with movement or palpation.  Moderate severity, had trouble sleeping last night due to the pain.  Denies trauma or any inciting event.  Denies chest pain or shortness of breath, no headache or vision change, no abdominal pain, no numbness or weakness to the arms or legs, no bowel or bladder dysfunction.  Review of Systems  A complete 10 system review of systems was obtained and all systems are negative except as noted in the HPI and PMH.   Patient's Health History    Past Medical History:  Diagnosis Date  . Cellulitis of right anterior lower leg     Past Surgical History:  Procedure Laterality Date  . COLON SURGERY      No family history on file.  Social History   Socioeconomic History  . Marital status: Married    Spouse name: Not on file  . Number of children: Not on file  . Years of education: Not on file  . Highest education level: Not on file  Occupational History  . Not on file  Social Needs  . Financial resource strain: Not on file  . Food insecurity    Worry: Not on file    Inability: Not on file  . Transportation needs    Medical: Not on file    Non-medical: Not on file  Tobacco Use  . Smoking status: Never Smoker  . Smokeless tobacco: Never Used  Substance and Sexual Activity  . Alcohol use: No  . Drug use: No  . Sexual activity: Not on file  Lifestyle  . Physical activity    Days per week: Not on file    Minutes per session: Not on file  . Stress: Not on file  Relationships  . Social Herbalist on phone: Not on file    Gets together: Not on file    Attends religious service: Not on file    Active member of club or organization: Not on file    Attends meetings of clubs or organizations: Not on file    Relationship status: Not on file  . Intimate partner violence    Fear of current or ex partner: Not on file    Emotionally abused: Not on file    Physically abused: Not on file    Forced sexual activity: Not on file  Other Topics Concern  . Not on file  Social History Narrative  . Not on file     Physical Exam  Vital Signs and Nursing Notes reviewed Vitals:   01/13/19 1012  BP: (!) 141/81  Pulse: 63  Resp: 16  Temp: 98.7 F (37.1 C)  SpO2: 99%    CONSTITUTIONAL: Well-appearing, NAD NEURO:  Alert and oriented x 3, no focal deficits EYES:  eyes equal and reactive ENT/NECK:  no LAD, no JVD CARDIO: Regular rate, well-perfused, normal S1 and S2 PULM:  CTAB no wheezing or rhonchi GI/GU:  normal bowel sounds, non-distended, non-tender MSK/SPINE:  No gross deformities, no edema; focal tenderness to  palpation to the right thoracic back SKIN:  no rash, atraumatic PSYCH:  Appropriate speech and behavior  Diagnostic and Interventional Summary    Labs Reviewed - No data to display  DG Chest 2 View    (Results Pending)    Medications - No data to display   Procedures Critical Care  ED Course and Medical Decision Making  I have reviewed the triage vital signs and the nursing notes.  Pertinent labs & imaging results that were available during my care of the patient were reviewed by me and considered in my medical decision making (see below for details).  Normal vital signs, no increased work of breathing, right-sided thoracic back pain that is reproducible on exam, will x-ray to exclude pneumothorax.  Patient has no right-sided CVA tenderness, the tenderness is much higher and therefore inconsistent with renal etiology.  Patient has no abdominal tenderness on exam,  normal neurological exam with nothing to suggest myelopathy.  Patient is not tachycardic, no evidence of DVT, nothing to suggest pulmonary embolism.  With a negative x-ray, will discharge with pain control.  After the discussed management above, the patient was determined to be safe for discharge.  The patient was in agreement with this plan and all questions regarding their care were answered.  ED return precautions were discussed and the patient will return to the ED with any significant worsening of condition.  Elmer SowMichael M. Pilar PlateBero, MD [facility] [email or web address]  Final Clinical Impressions(s) / ED Diagnoses     ICD-10-CM   1. Acute right-sided thoracic back pain  M54.6   2. Back pain  M54.9 DG Chest 2 View    DG Chest 2 View  3. Muscle spasm of back  M62.830     ED Discharge Orders    None         Sabas SousBero, Louis Gaw M, MD 01/13/19 1059    Sabas SousBero, Albert Devaul M, MD 01/13/19 1059

## 2019-05-27 ENCOUNTER — Ambulatory Visit: Payer: Managed Care, Other (non HMO) | Admitting: Podiatry

## 2019-06-02 ENCOUNTER — Ambulatory Visit (INDEPENDENT_AMBULATORY_CARE_PROVIDER_SITE_OTHER): Payer: Managed Care, Other (non HMO)

## 2019-06-02 ENCOUNTER — Ambulatory Visit (INDEPENDENT_AMBULATORY_CARE_PROVIDER_SITE_OTHER): Payer: Managed Care, Other (non HMO) | Admitting: Podiatry

## 2019-06-02 ENCOUNTER — Other Ambulatory Visit: Payer: Self-pay | Admitting: Podiatry

## 2019-06-02 ENCOUNTER — Other Ambulatory Visit: Payer: Self-pay

## 2019-06-02 ENCOUNTER — Encounter: Payer: Self-pay | Admitting: Podiatry

## 2019-06-02 VITALS — BP 143/90 | HR 75 | Resp 16

## 2019-06-02 DIAGNOSIS — M722 Plantar fascial fibromatosis: Secondary | ICD-10-CM

## 2019-06-02 DIAGNOSIS — M79671 Pain in right foot: Secondary | ICD-10-CM | POA: Diagnosis not present

## 2019-06-02 MED ORDER — DICLOFENAC SODIUM 75 MG PO TBEC: 75.0000 mg | DELAYED_RELEASE_TABLET | Freq: Two times a day (BID) | ORAL | 2 refills | Status: DC

## 2019-06-02 NOTE — Progress Notes (Signed)
   Subjective:    Patient ID: George Ruiz, male    DOB: [birth year], 59 y.o.   MRN: [id number]  HPI    Review of Systems  All other systems reviewed and are negative.      Objective:   Physical Exam        Assessment & Plan:

## 2019-06-02 NOTE — Progress Notes (Signed)
Subjective:   Patient ID: George Ruiz, male   DOB: 58 y.o.   MRN: [id number]   HPI Patient presents with exquisite discomfort plantar aspect right heel at the insertion of the tendon calcaneus and stated he had an episode of this 2 years ago in his left heel.  States that is been going on for about 4 months and has tried physical therapy and rolling a ball without relief.  Patient does not smoke and is quite active at work walking 12-14,000 steps per day   Review of Systems  All other systems reviewed and are negative.       Objective:  Physical Exam Vitals signs and nursing note reviewed.  Constitutional:      Appearance: He is well-developed.  Pulmonary:     Effort: Pulmonary effort is normal.  Musculoskeletal: Normal range of motion.  Skin:    General: Skin is warm.  Neurological:     Mental Status: He is alert.     Neurovascular status found to be intact muscle strength was found to be adequate with patient found to have exquisite discomfort plantar aspect right heel at the insertional point tendon calcaneus with inflammation fluid buildup and moderate cavus structure to the arch.  Patient has good digital perfusion well oriented x3     Assessment:  Acute plantar fasciitis right with inflammation fluid around the medial band     Plan:  H&P x-rays reviewed and today I did sterile prep and injected the plantar fascial 3 mg Kenalog 5 mg Xylocaine applied fascial brace and placed on diclofenac 75 mg twice daily.  Gave instructions for reduced activity and shoe gear modifications and may require long-term orthotics  X-rays indicate there is bone spur formation with cavus foot structure right

## 2019-06-02 NOTE — Patient Instructions (Signed)

## 2019-06-03 ENCOUNTER — Other Ambulatory Visit: Payer: Self-pay | Admitting: Gastroenterology

## 2019-06-03 DIAGNOSIS — R1310 Dysphagia, unspecified: Secondary | ICD-10-CM

## 2019-06-15 ENCOUNTER — Ambulatory Visit
Admission: RE | Admit: 2019-06-15 | Discharge: 2019-06-15 | Disposition: A | Discharge status: Home / Self Care | Payer: Managed Care, Other (non HMO) | Source: Ambulatory Visit | Attending: Gastroenterology | Admitting: Gastroenterology

## 2019-06-15 DIAGNOSIS — R1310 Dysphagia, unspecified: Secondary | ICD-10-CM

## 2019-06-16 ENCOUNTER — Other Ambulatory Visit: Payer: Self-pay

## 2019-06-16 ENCOUNTER — Encounter: Payer: Self-pay | Admitting: Podiatry

## 2019-06-16 ENCOUNTER — Ambulatory Visit (INDEPENDENT_AMBULATORY_CARE_PROVIDER_SITE_OTHER): Payer: Managed Care, Other (non HMO) | Admitting: Podiatry

## 2019-06-16 DIAGNOSIS — M722 Plantar fascial fibromatosis: Secondary | ICD-10-CM | POA: Diagnosis not present

## 2019-06-17 NOTE — Progress Notes (Signed)
Subjective:   Patient ID: George Ruiz, male   DOB: 59 y.o.   MRN: [id number]   HPI Patient states my right heel was somewhat improved but still gets quite sore especially after periods of sitting and getting up in the morning   ROS      Objective:  Physical Exam  Neurovascular status intact with continued discomfort plantar aspect right heel that is improved but is present and is quite painful with palpation still     Assessment:  Plantar fasciitis right present with moderate improvement     Plan:  H&P reviewed different physical therapy and shoe gear modifications and did sterile prep and reinjected the fascia 3 mg Kenalog 5 mg Xylocaine and went ahead today and dispensed a night splint with all instructions on usage along with aggressive ice therapy.  Reappoint 4 weeks to recheck

## 2019-07-14 ENCOUNTER — Encounter: Payer: Self-pay | Admitting: Podiatry

## 2019-07-14 ENCOUNTER — Other Ambulatory Visit: Payer: Self-pay

## 2019-07-14 ENCOUNTER — Ambulatory Visit (INDEPENDENT_AMBULATORY_CARE_PROVIDER_SITE_OTHER): Payer: Managed Care, Other (non HMO) | Admitting: Podiatry

## 2019-07-14 DIAGNOSIS — M79671 Pain in right foot: Secondary | ICD-10-CM | POA: Diagnosis not present

## 2019-07-14 DIAGNOSIS — M722 Plantar fascial fibromatosis: Secondary | ICD-10-CM

## 2019-07-14 NOTE — Progress Notes (Signed)
Subjective:   Patient ID: George Ruiz, male   DOB: 58 y.o.   MRN: [id number]   HPI Patient states he seems to be improving with the diminishment of discomfort within the plantar fascia and also the foot in general   ROS      Objective:  Physical Exam  Neurovascular status intact with discomfort only upon deep palpation with reduced edema and  redness currently     Assessment:  Doing well post significant plantar fascial event and foot event right      Plan:  H&P reviewed condition and recommended continued conservative care going over a great deal with him different avenues that he has in different mechanisms and discussed possible other long-term treatments if symptoms were to recur.  Patient will be seen back as needed

## 2019-09-20 ENCOUNTER — Ambulatory Visit: Payer: Managed Care, Other (non HMO) | Attending: Internal Medicine

## 2019-09-20 DIAGNOSIS — Z23 Encounter for immunization: Secondary | ICD-10-CM | POA: Insufficient documentation

## 2019-09-20 NOTE — Progress Notes (Signed)
   Covid-19 Vaccination Clinic  Name:  George Ruiz    MRN: [id number] DOB: 08-24-1960  09/20/2019  Mr. George Ruiz was observed post Covid-19 immunization for 15 minutes without incident. He was provided with Vaccine Information Sheet and instruction to access the V-Safe system.   Mr. George Ruiz was instructed to call 911 with any severe reactions post vaccine: Marland Kitchen Difficulty breathing  . Swelling of face and throat  . A fast heartbeat  . A bad rash all over body  . Dizziness and weakness   Immunizations Administered    Name Date Dose VIS Date Route   Pfizer COVID-19 Vaccine 09/20/2019  4:51 PM 0.3 mL 06/25/2019 Intramuscular   Manufacturer: [employer], [employer]   Lot: [id number]   NDC: [address]-5

## 2019-10-11 ENCOUNTER — Ambulatory Visit: Payer: Managed Care, Other (non HMO) | Attending: Internal Medicine

## 2019-10-11 DIAGNOSIS — Z23 Encounter for immunization: Secondary | ICD-10-CM

## 2019-10-11 NOTE — Progress Notes (Signed)
   Covid-19 Vaccination Clinic  Name:  [name]    MRN: [id number] DOB: 08-26-1960  10/11/2019  [name] was observed post Covid-19 immunization for 15 minutes without incident. He was provided with Vaccine Information Sheet and instruction to access the V-Safe system.   [name] was instructed to call 911 with any severe reactions post vaccine: Marland Kitchen Difficulty breathing  . Swelling of face and throat  . A fast heartbeat  . A bad rash all over body  . Dizziness and weakness   Immunizations Administered    Name Date Dose VIS Date Route   Pfizer COVID-19 Vaccine 10/11/2019  8:46 AM 0.3 mL 06/25/2019 Intramuscular   Manufacturer: [employer], [employer]   Lot: [id number]   NDC: [address]-8

## 2019-11-17 ENCOUNTER — Ambulatory Visit
Admission: RE | Admit: 2019-11-17 | Discharge: 2019-11-17 | Disposition: A | Discharge status: Home / Self Care | Payer: 59 | Source: Ambulatory Visit | Attending: Family Medicine | Admitting: Family Medicine

## 2019-11-17 ENCOUNTER — Other Ambulatory Visit: Payer: Self-pay | Admitting: Family Medicine

## 2019-11-17 DIAGNOSIS — R1110 Vomiting, unspecified: Secondary | ICD-10-CM

## 2019-11-17 DIAGNOSIS — Z8719 Personal history of other diseases of the digestive system: Secondary | ICD-10-CM

## 2019-11-23 ENCOUNTER — Other Ambulatory Visit: Payer: Self-pay | Admitting: Family Medicine

## 2019-11-23 ENCOUNTER — Other Ambulatory Visit (HOSPITAL_COMMUNITY): Payer: Self-pay | Admitting: Family Medicine

## 2019-11-23 DIAGNOSIS — R112 Nausea with vomiting, unspecified: Secondary | ICD-10-CM

## 2019-11-24 ENCOUNTER — Ambulatory Visit (HOSPITAL_COMMUNITY)
Admission: RE | Admit: 2019-11-24 | Discharge: 2019-11-24 | Disposition: A | Discharge status: Home / Self Care | Payer: 59 | Source: Ambulatory Visit | Attending: Family Medicine | Admitting: Family Medicine

## 2019-11-24 ENCOUNTER — Other Ambulatory Visit: Payer: Self-pay

## 2019-11-24 DIAGNOSIS — R112 Nausea with vomiting, unspecified: Secondary | ICD-10-CM | POA: Insufficient documentation

## 2019-12-22 ENCOUNTER — Emergency Department (HOSPITAL_BASED_OUTPATIENT_CLINIC_OR_DEPARTMENT_OTHER): Payer: 59

## 2019-12-22 ENCOUNTER — Other Ambulatory Visit: Payer: Self-pay

## 2019-12-22 ENCOUNTER — Encounter (HOSPITAL_BASED_OUTPATIENT_CLINIC_OR_DEPARTMENT_OTHER): Payer: Self-pay

## 2019-12-22 ENCOUNTER — Inpatient Hospital Stay (HOSPITAL_BASED_OUTPATIENT_CLINIC_OR_DEPARTMENT_OTHER)
Admission: EM | Admit: 2019-12-22 | Discharge: 2019-12-25 | DRG: 389 | Disposition: A | Discharge status: Home / Self Care | Payer: 59 | Attending: Surgery | Admitting: Surgery

## 2019-12-22 DIAGNOSIS — Z20822 Contact with and (suspected) exposure to covid-19: Secondary | ICD-10-CM | POA: Diagnosis present

## 2019-12-22 DIAGNOSIS — L03115 Cellulitis of right lower limb: Secondary | ICD-10-CM | POA: Diagnosis present

## 2019-12-22 DIAGNOSIS — H9190 Unspecified hearing loss, unspecified ear: Secondary | ICD-10-CM | POA: Diagnosis present

## 2019-12-22 DIAGNOSIS — K565 Intestinal adhesions [bands], unspecified as to partial versus complete obstruction: Principal | ICD-10-CM | POA: Diagnosis present

## 2019-12-22 DIAGNOSIS — K56609 Unspecified intestinal obstruction, unspecified as to partial versus complete obstruction: Secondary | ICD-10-CM | POA: Diagnosis not present

## 2019-12-22 DIAGNOSIS — Z0189 Encounter for other specified special examinations: Secondary | ICD-10-CM

## 2019-12-22 DIAGNOSIS — R112 Nausea with vomiting, unspecified: Secondary | ICD-10-CM

## 2019-12-22 DIAGNOSIS — K219 Gastro-esophageal reflux disease without esophagitis: Secondary | ICD-10-CM | POA: Diagnosis present

## 2019-12-22 HISTORY — DX: Gastro-esophageal reflux disease without esophagitis: K21.9

## 2019-12-22 HISTORY — DX: Unspecified intestinal obstruction, unspecified as to partial versus complete obstruction: K56.609

## 2019-12-22 HISTORY — DX: Unspecified hearing loss, unspecified ear: H91.90

## 2019-12-22 LAB — COMPREHENSIVE METABOLIC PANEL
ALT: 19 U/L (ref 0–44)
AST: 20 U/L (ref 15–41)
Albumin: 4.3 g/dL (ref 3.5–5.0)
Alkaline Phosphatase: 62 U/L (ref 38–126)
Anion gap: 8 (ref 5–15)
BUN: 19 mg/dL (ref 6–20)
CO2: 25 mmol/L (ref 22–32)
Calcium: 8.6 mg/dL — ABNORMAL LOW (ref 8.9–10.3)
Chloride: 103 mmol/L (ref 98–111)
Creatinine, Ser: 0.93 mg/dL (ref 0.61–1.24)
GFR calc Af Amer: >60 mL/min (ref >60)
GFR calc non Af Amer: >60 mL/min (ref >60)
Glucose, Bld: 110 mg/dL — ABNORMAL HIGH (ref 70–99)
Potassium: 4.2 mmol/L (ref 3.5–5.1)
Sodium: 136 mmol/L (ref 135–145)
Total Bilirubin: 1.6 mg/dL — ABNORMAL HIGH (ref 0.3–1.2)
Total Protein: 7.3 g/dL (ref 6.5–8.1)

## 2019-12-22 LAB — LIPASE, BLOOD: Lipase: 24 U/L (ref 11–51)

## 2019-12-22 LAB — CBC WITH DIFFERENTIAL/PLATELET
Abs Immature Granulocytes: 0.07 10*3/uL (ref 0.00–0.07)
Basophils Absolute: 0 10*3/uL (ref 0.0–0.1)
Basophils Relative: 0 %
Eosinophils Absolute: 0 10*3/uL (ref 0.0–0.5)
Eosinophils Relative: 0 %
HCT: 47.3 % (ref 39.0–52.0)
Hemoglobin: 15.8 g/dL (ref 13.0–17.0)
Immature Granulocytes: 1 %
Lymphocytes Relative: 6 %
Lymphs Abs: 0.9 10*3/uL (ref 0.7–4.0)
MCH: 30.9 pg (ref 26.0–34.0)
MCHC: 33.4 g/dL (ref 30.0–36.0)
MCV: 92.6 fL (ref 80.0–100.0)
Monocytes Absolute: 1 10*3/uL (ref 0.1–1.0)
Monocytes Relative: 7 %
Neutro Abs: 12.7 10*3/uL — ABNORMAL HIGH (ref 1.7–7.7)
Neutrophils Relative %: 86 %
Platelets: 180 10*3/uL (ref 150–400)
RBC: 5.11 MIL/uL (ref 4.22–5.81)
RDW: 12.2 % (ref 11.5–15.5)
WBC: 14.7 10*3/uL — ABNORMAL HIGH (ref 4.0–10.5)
nRBC: 0 % (ref 0.0–0.2)

## 2019-12-22 LAB — URINALYSIS, COMPLETE (UACMP) WITH MICROSCOPIC
Bilirubin Urine: NEGATIVE
Glucose, UA: NEGATIVE mg/dL
Hgb urine dipstick: NEGATIVE
Ketones, ur: 40 mg/dL — AB
Leukocytes,Ua: NEGATIVE
Nitrite: NEGATIVE
Protein, ur: NEGATIVE mg/dL
Specific Gravity, Urine: 1.01 (ref 1.005–1.030)
pH: 7.5 (ref 5.0–8.0)

## 2019-12-22 LAB — SARS CORONAVIRUS 2 BY RT PCR (HOSPITAL ORDER, PERFORMED IN ~~LOC~~ HOSPITAL LAB): SARS Coronavirus 2: NEGATIVE

## 2019-12-22 MED ORDER — SUCRALFATE 1 G PO TABS
1.0000 g | ORAL_TABLET | Freq: Once | ORAL | Status: DC
  Filled 2019-12-22: qty 1

## 2019-12-22 MED ORDER — ONDANSETRON HCL 4 MG/2ML IJ SOLN
4.0000 mg | Freq: Once | INTRAMUSCULAR | Status: AC
  Administered 2019-12-22: 4 mg via INTRAVENOUS
  Filled 2019-12-22: qty 2

## 2019-12-22 MED ORDER — IOHEXOL 300 MG/ML  SOLN
100.0000 mL | Freq: Once | INTRAMUSCULAR | Status: AC | PRN
  Administered 2019-12-22: 100 mL via INTRAVENOUS

## 2019-12-22 MED ORDER — MORPHINE SULFATE (PF) 4 MG/ML IV SOLN
4.0000 mg | Freq: Once | INTRAVENOUS | Status: AC
  Administered 2019-12-22: 4 mg via INTRAVENOUS
  Filled 2019-12-22: qty 1

## 2019-12-22 MED ORDER — LACTATED RINGERS IV BOLUS
1000.0000 mL | Freq: Once | INTRAVENOUS | Status: AC
  Administered 2019-12-22: 1000 mL via INTRAVENOUS

## 2019-12-22 MED ORDER — PANTOPRAZOLE SODIUM 40 MG IV SOLR
40.0000 mg | Freq: Every day | INTRAVENOUS | Status: DC
  Administered 2019-12-22 – 2019-12-24 (×3): 40 mg via INTRAVENOUS
  Filled 2019-12-22 (×4): qty 40

## 2019-12-22 MED ORDER — HEPARIN SODIUM (PORCINE) 5000 UNIT/ML IJ SOLN
5000.0000 [IU] | Freq: Three times a day (TID) | INTRAMUSCULAR | Status: DC
  Administered 2019-12-22 – 2019-12-25 (×8): 5000 [IU] via SUBCUTANEOUS
  Filled 2019-12-22 (×8): qty 1

## 2019-12-22 MED ORDER — KCL IN DEXTROSE-NACL 20-5-0.9 MEQ/L-%-% IV SOLN
INTRAVENOUS | Status: DC
  Filled 2019-12-22 (×5): qty 1000

## 2019-12-22 MED ORDER — SUCRALFATE 1 GM/10ML PO SUSP
ORAL | Status: AC
  Administered 2019-12-22: 1 g
  Filled 2019-12-22: qty 10

## 2019-12-22 MED ORDER — BENZOCAINE 20 % MT AERO
INHALATION_SPRAY | OROMUCOSAL | Status: AC
  Filled 2019-12-22: qty 57

## 2019-12-22 MED ORDER — DIATRIZOATE MEGLUMINE & SODIUM 66-10 % PO SOLN
90.0000 mL | Freq: Once | ORAL | Status: AC
  Administered 2019-12-23: 90 mL via NASOGASTRIC
  Filled 2019-12-22: qty 90

## 2019-12-22 MED ORDER — DICYCLOMINE HCL 10 MG PO CAPS
20.0000 mg | ORAL_CAPSULE | Freq: Once | ORAL | Status: AC
  Administered 2019-12-22: 20 mg via ORAL
  Filled 2019-12-22: qty 2

## 2019-12-22 MED ORDER — ONDANSETRON HCL 4 MG/2ML IJ SOLN
4.0000 mg | Freq: Four times a day (QID) | INTRAMUSCULAR | Status: DC | PRN
  Administered 2019-12-22: 4 mg via INTRAVENOUS
  Filled 2019-12-22: qty 2

## 2019-12-22 MED ORDER — MORPHINE SULFATE (PF) 2 MG/ML IV SOLN
1.0000 mg | INTRAVENOUS | Status: DC | PRN
  Administered 2019-12-22 – 2019-12-23 (×3): 2 mg via INTRAVENOUS
  Filled 2019-12-22 (×3): qty 1

## 2019-12-22 MED ORDER — ONDANSETRON 4 MG PO TBDP: 4.0000 mg | ORAL_TABLET | Freq: Four times a day (QID) | ORAL | Status: DC | PRN

## 2019-12-22 MED ORDER — LACTATED RINGERS IV SOLN: Freq: Once | INTRAVENOUS | Status: AC

## 2019-12-22 NOTE — ED Notes (Signed)
Pt transported to CT ?

## 2019-12-22 NOTE — ED Notes (Signed)
ED Provider at bedside. 

## 2019-12-22 NOTE — ED Triage Notes (Addendum)
Pt c/o left side abd pain started yesterday-states he felt constipated-fleets enema some stool with diarrhea-n/v vomited started ~5am-NAD-slow gait

## 2019-12-22 NOTE — ED Notes (Signed)
Pt aware of urine sample needed.

## 2019-12-22 NOTE — H&P (Signed)
George Ruiz is an 59 y.o. male.   Chief Complaint: Abdominal pain HPI: The patient is a 59 year old white male who presents with abdominal pain that started yesterday evening.  The pain increased during the night.  In the morning this morning he developed nausea and vomiting.  He took an enema which caused him to have a bowel movement and he felt a little bit better but then the nausea and vomiting came back.  He went to the emergency department where a CT scan suggested that he had a small bowel obstruction likely secondary to adhesions.  He had a similar episode 6 years ago which resolved without surgery.  He had surgery as a child to remove a segment of small intestine but he is not sure why he had the surgery.  He denies any fevers or chills.  He feels much better now that he has had an NG tube placed.  He denies any current pain.  Past Medical History:  Diagnosis Date  . Bowel obstruction ([place])   . Cellulitis of right anterior lower leg   . GERD (gastroesophageal reflux disease)   . HOH (hard of hearing)     Past Surgical History:  Procedure Laterality Date  . COLON SURGERY      No family history on file. Social History:  reports that he has never smoked. He has never used smokeless tobacco. He reports current alcohol use. He reports that he does not use drugs.  Allergies: No Known Allergies  (Not in a hospital admission)   Results for orders placed or performed during the hospital encounter of 12/22/19 (from the past 48 hour(s))  Comprehensive metabolic panel     Status: Abnormal   Collection Time: 12/22/19  2:13 PM  Result Value Ref Range   Sodium 136 135 - 145 mmol/L   Potassium 4.2 3.5 - 5.1 mmol/L   Chloride 103 98 - 111 mmol/L   CO2 25 22 - 32 mmol/L   Glucose, Bld 110 (H) 70 - 99 mg/dL    Comment: Glucose reference range applies only to samples taken after fasting for at least 8 hours.   BUN 19 6 - 20 mg/dL   Creatinine, Ser 0.93 0.61 - 1.24 mg/dL   Calcium 8.6  (L) 8.9 - 10.3 mg/dL   Total Protein 7.3 6.5 - 8.1 g/dL   Albumin 4.3 3.5 - 5.0 g/dL   AST 20 15 - 41 U/L   ALT 19 0 - 44 U/L   Alkaline Phosphatase 62 38 - 126 U/L   Total Bilirubin 1.6 (H) 0.3 - 1.2 mg/dL   GFR calc non Af Amer >60 >60 mL/min   GFR calc Af Amer >60 >60 mL/min   Anion gap 8 5 - 15    Comment: Performed at [facility], [address] [place], [place] [address]  Lipase, blood     Status: None   Collection Time: 12/22/19  2:13 PM  Result Value Ref Range   Lipase 24 11 - 51 U/L    Comment: Performed at [facility], [address] [place], [place] [address]  CBC with Diff     Status: Abnormal   Collection Time: 12/22/19  2:13 PM  Result Value Ref Range   WBC 14.7 (H) 4.0 - 10.5 K/uL   RBC 5.11 4.22 - 5.81 MIL/uL   Hemoglobin 15.8 13.0 - 17.0 g/dL   HCT 47.3 39.0 - 52.0 %   MCV 92.6 80.0 -  100.0 fL   MCH 30.9 26.0 - 34.0 pg   MCHC 33.4 30.0 - 36.0 g/dL   RDW 62.7 03.5 - 00.9 %   Platelets 180 150 - 400 K/uL   nRBC 0.0 0.0 - 0.2 %   Neutrophils Relative % 86 %   Neutro Abs 12.7 (H) 1.7 - 7.7 K/uL   Lymphocytes Relative 6 %   Lymphs Abs 0.9 0.7 - 4.0 K/uL   Monocytes Relative 7 %   Monocytes Absolute 1.0 0.1 - 1.0 K/uL   Eosinophils Relative 0 %   Eosinophils Absolute 0.0 0.0 - 0.5 K/uL   Basophils Relative 0 %   Basophils Absolute 0.0 0.0 - 0.1 K/uL   Immature Granulocytes 1 %   Abs Immature Granulocytes 0.07 0.00 - 0.07 K/uL    Comment: Performed at [facility], [address] [place], [place] [address]  Urinalysis, Complete w Microscopic     Status: Abnormal   Collection Time: 12/22/19  4:30 PM  Result Value Ref Range   Color, Urine YELLOW YELLOW   APPearance CLEAR CLEAR   Specific Gravity, Urine 1.010 1.005 - 1.030   pH 7.5 5.0 - 8.0   Glucose, UA NEGATIVE NEGATIVE mg/dL   Hgb urine dipstick NEGATIVE NEGATIVE   Bilirubin Urine NEGATIVE NEGATIVE   Ketones, ur 40 (A) NEGATIVE mg/dL   Protein, ur NEGATIVE NEGATIVE  mg/dL   Nitrite NEGATIVE NEGATIVE   Leukocytes,Ua NEGATIVE NEGATIVE   Squamous Epithelial / LPF 0-5 0 - 5   WBC, UA 0-5 0 - 5 WBC/hpf   RBC / HPF 0-5 0 - 5 RBC/hpf   Bacteria, UA RARE (A) NONE SEEN   Mucus PRESENT     Comment: Performed at [facility], [address] [place], [place] [address]  SARS Coronavirus 2 by RT PCR (hospital order, performed in [facility] hospital lab) Nasopharyngeal Nasopharyngeal Swab     Status: None   Collection Time: 12/22/19  5:08 PM   Specimen: Nasopharyngeal Swab  Result Value Ref Range   SARS Coronavirus 2 NEGATIVE NEGATIVE    Comment: (NOTE) SARS-CoV-2 target nucleic acids are NOT DETECTED. The SARS-CoV-2 RNA is generally detectable in upper and lower respiratory specimens during the acute phase of infection. The lowest concentration of SARS-CoV-2 viral copies this assay can detect is 250 copies / mL. A negative result does not preclude SARS-CoV-2 infection and should not be used as the sole basis for treatment or other patient management decisions.  A negative result may occur with improper specimen collection / handling, submission of specimen other than nasopharyngeal swab, presence of viral mutation(s) within the areas targeted by this assay, and inadequate number of viral copies (<250 copies / mL). A negative result must be combined with clinical observations, patient history, and epidemiological information. Fact Sheet for Patients:   BoilerBrush.com.cy Fact Sheet for Healthcare Providers: https://pope.com/ This test is not yet approved or cleared  by the [place] FDA and has been authorized for detection and/or diagnosis of SARS-CoV-2 by FDA under an Emergency Use Authorization (EUA).  This EUA will remain in effect (meaning this test can be used) for the duration of the COVID-19 declaration under Section 564(b)(1) of the Act, 21 U.S.C. section 360bbb-3(b)(1), unless the  authorization is terminated or revoked sooner. Performed at [facility], [address], [place], [place] [address]    DG Abdomen 1 View  Result Date: 12/22/2019 CLINICAL DATA:  Enteric catheter placement, small-bowel obstruction  EXAM: ABDOMEN - 1 VIEW COMPARISON:  12/22/2019 FINDINGS: Frontal view of the lower chest and upper abdomen demonstrates enteric catheter tip projecting over gastric fundus. Dilated loops of small bowel consistent with obstruction. Lung bases are clear. IMPRESSION: 1. Enteric catheter overlying gastric fundus. Electronically Signed   By: Sharlet Salina M.D.   On: 12/22/2019 18:53   CT ABDOMEN PELVIS W CONTRAST  Result Date: 12/22/2019 CLINICAL DATA:  Left-sided abdominal pain, onset yesterday. Nausea and vomiting today. Left lower quadrant/suprapubic pain. EXAM: CT ABDOMEN AND PELVIS WITH CONTRAST TECHNIQUE: Multidetector CT imaging of the abdomen and pelvis was performed using the standard protocol following bolus administration of intravenous contrast. CONTRAST:  OMNIPAQUE IOHEXOL 300 MG/ML  SOLN COMPARISON:  CT 02/03/2014 FINDINGS: Lower chest: Lung bases are clear. Hepatobiliary: Mild decreased hepatic density consistent with steatosis. Tiny hypodensity in the left lobe of the liver is too small to accurately characterize. Gallbladder physiologically distended, no calcified stone. No biliary dilatation. Pancreas: No ductal dilatation or inflammation. Spleen: Normal in size without focal abnormality. Adrenals/Urinary Tract: Normal adrenal glands. No hydronephrosis or perinephric edema. Homogeneous renal enhancement with symmetric excretion on delayed phase imaging. Urinary bladder is physiologically distended without wall thickening. Stomach/Bowel: Distended stomach. Dilated fluid-filled small bowel with fecalization of small bowel contents in the lower pelvis. Transition point just distal to fecalized small bowel in the central abdomen to the right of midline,  series 5, image 32. There is mesenteric engorgement and inflammation in the region of transition. More distal small bowel are decompressed. Appendix not definitively visualized. Formed stool in the proximal colon, remainder of the colon is decompressed. Mild distal colonic diverticulosis without diverticulitis. Vascular/Lymphatic: Normal caliber abdominal aorta. The portal vein is patent. No adenopathy. Reproductive: Prostate is unremarkable. Other: Mesenteric edema in the region of small bowel transition. Small amount of mesenteric free fluid the tracks into the pelvis. There is no free intra-abdominal air or organized fluid collection. 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 Vertebral body hemangioma within L2. Few scattered bone islands in the pelvis. IMPRESSION: 1. Small bowel obstruction with transition point in the central abdomen to the right of midline, likely secondary to adhesions. 2. Mild distal colonic diverticulosis without diverticulitis. 3. 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 4. Mild hepatic steatosis. Electronically Signed   By: Narda Rutherford M.D.   On: 12/22/2019 16:45    Review of Systems  Constitutional: Negative.   HENT: Negative.   Eyes: Negative.   Respiratory: Negative.   Cardiovascular: Negative.   Gastrointestinal: Positive for abdominal distention, abdominal pain, constipation, nausea and vomiting.  Endocrine: Negative.   Genitourinary: Negative.   Musculoskeletal: Negative.   Skin: Negative.   Allergic/Immunologic: Negative.   Neurological: Negative.   Hematological: Negative.   Psychiatric/Behavioral: Negative.     Blood pressure 125/77, pulse (!) 56, temperature 99.4 F (37.4 C), resp. rate 16, height 6' (1.829 m), weight 94.6 kg, SpO2 98 %. Physical Exam  Constitutional: He is oriented to person,  place, and time. He appears well-developed and well-nourished. No distress.  HENT:  Head: Normocephalic and atraumatic.  Right Ear: External ear normal.  Left Ear: External ear normal.  Mouth/Throat: Oropharynx is clear and moist.  Eyes: Pupils are equal, round, and reactive to light. Conjunctivae and EOM are normal. No scleral icterus.  Neck: No tracheal deviation present.  Cardiovascular: Normal rate, regular rhythm, normal heart sounds and intact  distal pulses.  No pitting edema of the lower extremities  Respiratory: Effort normal and breath sounds normal. No respiratory distress.  No use of accessory respiratory muscles  GI: Soft. Bowel sounds are normal. He exhibits no distension. There is no abdominal tenderness.  Musculoskeletal:        General: No tenderness, deformity or edema. Normal range of motion.     Cervical back: Normal range of motion and neck supple.     Comments: Normal gait  Lymphadenopathy:    He has no cervical adenopathy.  No palpable groin or cervical lymphadenopathy  Neurological: He is alert and oriented to person, place, and time.  Skin: Skin is warm and dry. No rash noted.  Psychiatric: He has a normal mood and affect. His behavior is normal. Thought content normal.     Assessment/Plan The patient appears to have a small bowel obstruction likely secondary to adhesions.  At this point we will plan to treat him with bowel rest and NG tube decompression.  We will admit him to the hospital.  We will begin the small bowel protocol.  If he improves then he may avoid surgery.  If he does not improve then he may require exploration.  We will monitor him closely.  Chevis Pretty III, MD 12/22/2019, 9:51 PM

## 2019-12-22 NOTE — ED Provider Notes (Signed)
[facility] EMERGENCY DEPARTMENT Provider Note   CSN: [id number] Arrival date & time: 12/22/19  1321     History Chief Complaint  Patient presents with  . Abdominal Pain    George Ruiz is a 59 y.o. male.  HPI  Patient is a 59 year old male with past medical history significant for bowel obstruction approximately 5 years ago also with history of removal of small intestines as a child.  He states he is uncertain why this surgery occurred.  He denies any hernias.  Patient states that for the past 24 hours he has had left-sided lower abdominal pain.  He states it is achy, sharp occasionally stabbing and associated with nausea and vomiting.  He states vomiting is nonbloody nonbilious.  He states he has had one small bowel movement that was soft but very difficult to pass.  He states that vomiting began proximally 5 AM this morning he has had approximately 3 episodes.  He states it has been forceful vomiting.  Denies any fevers, chills, lightheadedness or dizziness.  He denies any other significant changes to his stool such as melena or hematochezia.  He denies any history of diverticulitis.  He also denies history kidney stones.  He does endorse some dysuria although he states that he feels this may be related to him straining in efforts to defecate.  Patient states he did try to use an enema yesterday because he felt that he was constipated and states he had a small amount of loose stool afterwards.  No relief with the enema.     Past Medical History:  Diagnosis Date  . Bowel obstruction ([facility])   . Cellulitis of right anterior lower leg   . GERD (gastroesophageal reflux disease)   . HOH (hard of hearing)     Patient Active Problem List   Diagnosis Date Noted  . Right knee injury, subsequent encounter 07/17/2017  . Small bowel obstruction ([facility]) 02/03/2014  . SBO (small bowel obstruction) ([facility]) 02/03/2014    Past Surgical History:  Procedure Laterality Date  . COLON  SURGERY         No family history on file.  Social History   Tobacco Use  . Smoking status: Never Smoker  . Smokeless tobacco: Never Used  Substance Use Topics  . Alcohol use: Yes    Comment: occ  . Drug use: No    Home Medications Prior to Admission medications   Medication Sig Start Date End Date Taking? Authorizing Provider  aspirin EC 81 MG tablet Take by mouth. 09/06/13   [provider]  cyclobenzaprine (FLEXERIL) 10 MG tablet Take 1 tablet (10 mg total) by mouth 2 (two) times daily as needed for muscle spasms. 01/13/19   Sabas Sous, MD  diclofenac (VOLTAREN) 75 MG EC tablet Take 1 tablet (75 mg total) by mouth 2 (two) times daily. 06/02/19   Lenn Sink, DPM  lidocaine (LIDODERM) 5 % Place 1 patch onto the skin daily. Remove & Discard patch within 12 hours or as directed by MD 01/13/19   Sabas Sous, MD  omeprazole (PRILOSEC) 40 MG capsule Take 40 mg by mouth daily. 06/03/19   [provider]    Allergies    Patient has no known allergies.  Review of Systems   Review of Systems  Constitutional: Negative for chills and fever.  HENT: Negative for congestion.   Eyes: Negative for pain.  Respiratory: Negative for cough and shortness of breath.   Cardiovascular: Negative for chest  pain and leg swelling.  Gastrointestinal: Positive for abdominal pain, nausea and vomiting.  Genitourinary: Positive for dysuria. Negative for discharge, hematuria, penile pain, penile swelling, scrotal swelling, testicular pain and urgency.  Musculoskeletal: Negative for myalgias.  Skin: Negative for rash.  Neurological: Negative for dizziness and headaches.    Physical Exam Updated Vital Signs BP 125/63 (BP Location: Right Arm)   Pulse (!) 50   Temp 99.2 F (37.3 C) (Oral)   Resp 12   Ht 6' (1.829 m)   Wt 94.6 kg   SpO2 100%   BMI 28.29 kg/m   Physical Exam Vitals and nursing note reviewed.  Constitutional:      General: He is not in acute  distress. HENT:     Head: Normocephalic and atraumatic.     Nose: Nose normal.  Eyes:     General: No scleral icterus. Cardiovascular:     Rate and Rhythm: Normal rate and regular rhythm.     Pulses: Normal pulses.     Heart sounds: Normal heart sounds.  Pulmonary:     Effort: Pulmonary effort is normal. No respiratory distress.     Breath sounds: No wheezing.  Abdominal:     Palpations: Abdomen is soft.     Tenderness: There is abdominal tenderness.     Comments: Abdomen is soft and tender in the left lower quadrant and suprapubic region.  No guarding or rebound.  No masses palpated.  No CVA tenderness.  Musculoskeletal:     Cervical back: Normal range of motion.     Right lower leg: No edema.     Left lower leg: No edema.  Skin:    General: Skin is warm and dry.     Capillary Refill: Capillary refill takes less than 2 seconds.  Neurological:     Mental Status: He is alert. Mental status is at baseline.  Psychiatric:        Mood and Affect: Mood normal.        Behavior: Behavior normal.     ED Results / Procedures / Treatments   Labs (all labs ordered are listed, but only abnormal results are displayed) Labs Reviewed  COMPREHENSIVE METABOLIC PANEL - Abnormal; Notable for the following components:      Result Value   Glucose, Bld 110 (*)    Calcium 8.6 (*)    Total Bilirubin 1.6 (*)    All other components within normal limits  CBC WITH DIFFERENTIAL/PLATELET - Abnormal; Notable for the following components:   WBC 14.7 (*)    Neutro Abs 12.7 (*)    All other components within normal limits  URINALYSIS, COMPLETE (UACMP) WITH MICROSCOPIC - Abnormal; Notable for the following components:   Ketones, ur 40 (*)    Bacteria, UA RARE (*)    All other components within normal limits  SARS CORONAVIRUS 2 BY RT PCR (HOSPITAL ORDER, [facility])  URINE CULTURE  LIPASE, BLOOD    EKG None  Radiology CT ABDOMEN PELVIS W CONTRAST  Result Date:  12/22/2019 CLINICAL DATA:  Left-sided abdominal pain, onset yesterday. Nausea and vomiting today. Left lower quadrant/suprapubic pain. EXAM: CT ABDOMEN AND PELVIS WITH CONTRAST TECHNIQUE: Multidetector CT imaging of the abdomen and pelvis was performed using the standard protocol following bolus administration of intravenous contrast. CONTRAST:  13mL OMNIPAQUE IOHEXOL 300 MG/ML  SOLN COMPARISON:  CT 02/03/2014 FINDINGS: Lower chest: Lung bases are clear. Hepatobiliary: Mild decreased hepatic density consistent with steatosis. Tiny hypodensity in the left lobe  of the liver is too small to accurately characterize. Gallbladder physiologically distended, no calcified stone. No biliary dilatation. Pancreas: No ductal dilatation or inflammation. Spleen: Normal in size without focal abnormality. Adrenals/Urinary Tract: Normal adrenal glands. No hydronephrosis or perinephric edema. Homogeneous renal enhancement with symmetric excretion on delayed phase imaging. Urinary bladder is physiologically distended without wall thickening. Stomach/Bowel: Distended stomach. Dilated fluid-filled small bowel with fecalization of small bowel contents in the lower pelvis. Transition point just distal to fecalized small bowel in the central abdomen to the right of midline, series 5, image 32. There is mesenteric engorgement and inflammation in the region of transition. More distal small bowel are decompressed. Appendix not definitively visualized. Formed stool in the proximal colon, remainder of the colon is decompressed. Mild distal colonic diverticulosis without diverticulitis. Vascular/Lymphatic: Normal caliber abdominal aorta. The portal vein is patent. No adenopathy. Reproductive: Prostate is unremarkable. Other: Mesenteric edema in the region of small bowel transition. Small amount of mesenteric free fluid the tracks into the pelvis. There is no free intra-abdominal air or organized fluid collection. Musculoskeletal: Bilateral L5  pars interarticularis defects with anterolisthesis of L5 on S1 and associated degenerative change. Vertebral body hemangioma within L2. Few scattered bone islands in the pelvis. IMPRESSION: 1. Small bowel obstruction with transition point in the central abdomen to the right of midline, likely secondary to adhesions. 2. Mild distal colonic diverticulosis without diverticulitis. 3. 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 4. Mild hepatic steatosis. Electronically Signed   By: Narda Rutherford M.D.   On: 12/22/2019 16:45    Procedures Procedures (including critical care time)  Medications Ordered in ED Medications  sucralfate (CARAFATE) tablet 1 g (1 g Oral Not Given 12/22/19 1509)  ondansetron (ZOFRAN) injection 4 mg (4 mg Intravenous Given 12/22/19 1421)  dicyclomine (BENTYL) capsule 20 mg (20 mg Oral Given 12/22/19 1502)  lactated ringers infusion ( Intravenous Stopped 12/22/19 1524)  sucralfate (CARAFATE) 1 GM/10ML suspension (1 g  Given 12/22/19 1502)  iohexol (OMNIPAQUE) 300 MG/ML solution 100 mL (100 mLs Intravenous Contrast Given 12/22/19 1616)  ondansetron (ZOFRAN) injection 4 mg (4 mg Intravenous Given 12/22/19 1710)  lactated ringers bolus 1,000 mL (1,000 mLs Intravenous New Bag/Given 12/22/19 1759)  morphine 4 MG/ML injection 4 mg (4 mg Intravenous Given 12/22/19 1754)  Benzocaine (HURRCAINE) 20 % mouth spray (  Given 12/22/19 1815)    ED Course  I have reviewed the triage vital signs and the nursing notes.  Pertinent labs & imaging results that were available during my care of the patient were reviewed by me and considered in my medical decision making (see chart for details).  Patient is a 59 year old gentleman with a history of SBO 5 years ago presented today with symptoms consistent with this which include left lower quadrant and suprapubic abdominal pain.  He also does endorse one episode of dysuria but denies any frequency,  urgency, flank pain.  He has no history of UTIs in the past.  We will obtain basic labs, urine, CT scan to evaluate for SBO.  CT scan shows SBO with transition point.  Patient has mild leukocytosis 14.7.  Likely secondary to significant vomiting.  UA is equivocal with rare bacteria present.  Patient has had no other episodes of dysuria will follow up on culture results in hospital.  CMP without significant electrolyte abnormalities.  Lipase is within normal limits doubt pancreatitis.  Covid swab is negative.  Clinical Course as of Dec 21 9824  Wed Dec 22, 2019  1742 Discussed with surgery who recommends hospitalist admission   [WF]  1751 Discussed with Dr. Carolynne Edouard who will admit patient at [facility] hospital. Pt will be sent to ER for surgery to evaluate. Call placed to [facility] attending. Charge nurse notified of disposition   [WF]  [id number] Discussed with Dr. Stevie Kern who accepts transfer of pt. To [facility].  Surgery will evaluate once he arrives in [facility] ED   [WF]    Clinical Course User Index [WF] Gailen Shelter, [place]   NG tube placed.  Patient is on second liter of fluids.  He has received Zofran twice and has received 1 dose of morphine.  He states his pain is well controlled at this time.  MDM Rules/Calculators/A&P                      Final Clinical Impression(s) / ED Diagnoses Final diagnoses:  SBO (small bowel obstruction) ([facility])  Non-intractable vomiting with nausea, unspecified vomiting type    Rx / DC Orders ED Discharge Orders    None       Gailen Shelter, [place] 12/22/19 Vinetta Bergamo, MD 12/24/19 [phone]

## 2019-12-22 NOTE — ED Provider Notes (Signed)
59 year old male received in transfer from med [facility].  Patient with small bowel obstruction.  Dr. Carolynne Edouard with general surgery was contacted and plan for him to evaluate patient here in the ED with likely admission. Physical Exam  BP 125/77   Pulse (!) 56   Temp 99.4 F (37.4 C)   Resp 16   Ht 6' (1.829 m)   Wt 94.6 kg   SpO2 98%   BMI 28.29 kg/m   Physical Exam Vitals and nursing note reviewed.  Constitutional:      General: He is not in acute distress.    Appearance: He is well-developed. He is not toxic-appearing or diaphoretic.  HENT:     Head: Atraumatic.     Nose:     Comments: NG tube in place suctioning yellow/green liquid Eyes:     Pupils: Pupils are equal, round, and reactive to light.  Cardiovascular:     Rate and Rhythm: Normal rate and regular rhythm.  Pulmonary:     Effort: Pulmonary effort is normal. No respiratory distress.  Abdominal:     General: There is no distension.     Palpations: Abdomen is soft.  Musculoskeletal:        General: Normal range of motion.     Cervical back: Normal range of motion and neck supple.  Skin:    General: Skin is warm and dry.  Neurological:     Mental Status: He is alert.    ED Course/Procedures   Clinical Course as of Dec 22 2246  Wed Dec 22, 2019  1742 Discussed with surgery who recommends hospitalist admission   [WF]  1751 Discussed with Dr. Carolynne Edouard who will admit patient at [facility] hospital. Pt will be sent to ER for surgery to evaluate. Call placed to [facility] attending. Charge nurse notified of disposition   [WF]  [id number] Discussed with Dr. Stevie Kern who accepts transfer of pt. To [facility].  Surgery will evaluate once he arrives in [facility] ED   [WF]    Clinical Course User Index [WF] Gailen Shelter, [place]    Procedures Labs Reviewed  COMPREHENSIVE METABOLIC PANEL - Abnormal; Notable for the following components:      Result Value   Glucose, Bld 110 (*)    Calcium 8.6 (*)    Total Bilirubin 1.6 (*)    All other components  within normal limits  CBC WITH DIFFERENTIAL/PLATELET - Abnormal; Notable for the following components:   WBC 14.7 (*)    Neutro Abs 12.7 (*)    All other components within normal limits  URINALYSIS, COMPLETE (UACMP) WITH MICROSCOPIC - Abnormal; Notable for the following components:   Ketones, ur 40 (*)    Bacteria, UA RARE (*)    All other components within normal limits  SARS CORONAVIRUS 2 BY RT PCR (HOSPITAL ORDER, PERFORMED IN [facility])  URINE CULTURE  LIPASE, BLOOD  DG Abdomen 1 View  Result Date: 12/22/2019 CLINICAL DATA:  Enteric catheter placement, small-bowel obstruction EXAM: ABDOMEN - 1 VIEW COMPARISON:  12/22/2019 FINDINGS: Frontal view of the lower chest and upper abdomen demonstrates enteric catheter tip projecting over gastric fundus. Dilated loops of small bowel consistent with obstruction. Lung bases are clear. IMPRESSION: 1. Enteric catheter overlying gastric fundus. Electronically Signed   By: Sharlet Salina M.D.   On: 12/22/2019 18:53   US Abdomen Complete  Result Date: 11/24/2019 CLINICAL DATA:  Nausea and vomiting EXAM: ABDOMEN ULTRASOUND COMPLETE COMPARISON:  CT abdomen and pelvis February 03, 2014  FINDINGS: Gallbladder: No gallstones or wall thickening visualized. There is no pericholecystic fluid. No sonographic Murphy sign noted by sonographer. Common bile duct: Diameter: 6 mm. No intrahepatic, common hepatic, or common bile duct dilatation. Liver: No focal lesion identified. Within normal limits in parenchymal echogenicity. Portal vein is patent on color Doppler imaging with normal direction of blood flow towards the liver. IVC: No abnormality visualized. Pancreas: No pancreatic mass or inflammatory focus. Spleen: Size and appearance within normal limits. Right Kidney: Length: 11.9 cm. Echogenicity within normal limits. No mass or hydronephrosis visualized. Left Kidney: Length: 12.5 cm. Echogenicity within normal limits. No mass or hydronephrosis visualized.  Abdominal aorta: No aneurysm visualized. Other findings: No demonstrable ascites. IMPRESSION: Study within normal limits. Electronically Signed   By: Lowella Grip III M.D.   On: 11/24/2019 10:42   CT ABDOMEN PELVIS W CONTRAST  Result Date: 12/22/2019 CLINICAL DATA:  Left-sided abdominal pain, onset yesterday. Nausea and vomiting today. Left lower quadrant/suprapubic pain. EXAM: CT ABDOMEN AND PELVIS WITH CONTRAST TECHNIQUE: Multidetector CT imaging of the abdomen and pelvis was performed using the standard protocol following bolus administration of intravenous contrast. CONTRAST:  161mL OMNIPAQUE IOHEXOL 300 MG/ML  SOLN COMPARISON:  CT 02/03/2014 FINDINGS: Lower chest: Lung bases are clear. Hepatobiliary: Mild decreased hepatic density consistent with steatosis. Tiny hypodensity in the left lobe of the liver is too small to accurately characterize. Gallbladder physiologically distended, no calcified stone. No biliary dilatation. Pancreas: No ductal dilatation or inflammation. Spleen: Normal in size without focal abnormality. Adrenals/Urinary Tract: Normal adrenal glands. No hydronephrosis or perinephric edema. Homogeneous renal enhancement with symmetric excretion on delayed phase imaging. Urinary bladder is physiologically distended without wall thickening. Stomach/Bowel: Distended stomach. Dilated fluid-filled small bowel with fecalization of small bowel contents in the lower pelvis. Transition point just distal to fecalized small bowel in the central abdomen to the right of midline, series 5, image 32. There is mesenteric engorgement and inflammation in the region of transition. More distal small bowel are decompressed. Appendix not definitively visualized. Formed stool in the proximal colon, remainder of the colon is decompressed. Mild distal colonic diverticulosis without diverticulitis. Vascular/Lymphatic: Normal caliber abdominal aorta. The portal vein is patent. No adenopathy. Reproductive: Prostate  is unremarkable. Other: Mesenteric edema in the region of small bowel transition. Small amount of mesenteric free fluid the tracks into the pelvis. There is no free intra-abdominal air or organized fluid collection. 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 Vertebral body hemangioma within L2. Few scattered bone islands in the pelvis. IMPRESSION: 1. Small bowel obstruction with transition point in the central abdomen to the right of midline, likely secondary to adhesions. 2. Mild distal colonic diverticulosis without diverticulitis. 3. 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 4. Mild hepatic steatosis. Electronically Signed   By: Keith Rake M.D.   On: 12/22/2019 16:45   MDM  Patient received in transfer from [facility] for small bowel obstruction and general surgery consult.  Patient states symptoms have significantly improved since NG tube placed.  Abdomen soft mildly tender.  He denies any additional need for any pain medication or antiemetics.  He does not appear septic.  Dr. Marlou Starks with general surgery has been consulted and will evaluate patient for admission.  The patient appears reasonably stabilized for admission considering the current resources, flow, and capabilities available in the ED at this time, and I doubt any other  EMC requiring further screening and/or treatment in the ED prior to admission.     Marcha Licklider A, PA-C 12/22/19 2248    Sabas Sous, MD 12/22/19 2333

## 2019-12-23 ENCOUNTER — Inpatient Hospital Stay (HOSPITAL_COMMUNITY): Payer: 59

## 2019-12-23 LAB — BASIC METABOLIC PANEL
Anion gap: 7 (ref 5–15)
BUN: 16 mg/dL (ref 6–20)
CO2: 27 mmol/L (ref 22–32)
Calcium: 8.4 mg/dL — ABNORMAL LOW (ref 8.9–10.3)
Chloride: 104 mmol/L (ref 98–111)
Creatinine, Ser: 0.89 mg/dL (ref 0.61–1.24)
GFR calc Af Amer: >60 mL/min (ref >60)
GFR calc non Af Amer: >60 mL/min (ref >60)
Glucose, Bld: 123 mg/dL — ABNORMAL HIGH (ref 70–99)
Potassium: 3.9 mmol/L (ref 3.5–5.1)
Sodium: 138 mmol/L (ref 135–145)

## 2019-12-23 LAB — CBC
HCT: 44 % (ref 39.0–52.0)
Hemoglobin: 14.7 g/dL (ref 13.0–17.0)
MCH: 31.1 pg (ref 26.0–34.0)
MCHC: 33.4 g/dL (ref 30.0–36.0)
MCV: 93 fL (ref 80.0–100.0)
Platelets: 173 10*3/uL (ref 150–400)
RBC: 4.73 MIL/uL (ref 4.22–5.81)
RDW: 12.4 % (ref 11.5–15.5)
WBC: 11.9 10*3/uL — ABNORMAL HIGH (ref 4.0–10.5)
nRBC: 0 % (ref 0.0–0.2)

## 2019-12-23 LAB — URINE CULTURE: Culture: NO GROWTH

## 2019-12-23 LAB — HIV ANTIBODY (ROUTINE TESTING W REFLEX): HIV Screen 4th Generation wRfx: NONREACTIVE

## 2019-12-23 NOTE — Progress Notes (Signed)
[facility] Progress Note     Subjective: CC:  Denies abd pain. Denies flatus or BM.   Confirms history of SBR as a child as well as open appendectomy in the 1970's or 80's. Confirms history of SBO about 6 years ago which resolved non-operatively.  Objective: Vital signs in last 24 hours: Temp:  [97.6 F (36.4 C)-99.4 F (37.4 C)] 98.4 F (36.9 C) (06/10 0947) Pulse Rate:  [48-98] 59 (06/10 0947) Resp:  [12-18] 16 (06/10 0555) BP: (116-148)/(55-101) 148/78 (06/10 0947) SpO2:  [95 %-100 %] 97 % (06/10 0947) Weight:  [94.6 kg] 94.6 kg (06/09 1329) Last BM Date: 12/22/19 (had a fleet enema)  Intake/Output from previous day: 06/09 0701 - 06/10 0700 In: 2682.9 [I.V.:1682.3; IV Piggyback:1000.6] Out: 900 [Emesis/NG output:700] Intake/Output this shift: Total I/O In: 0  Out: 150 [Emesis/NG output:150]  PE: Gen:  Alert, NAD, pleasant Card:  Regular rate and rhythm, pedal pulses 2+ BL Pulm:  Normal effort, clear to auscultation bilaterally Abd: Soft, non-tender, mild distention, hypoactive BS  NG - 700-800 cc since placement yesterday, bilious Skin: warm and dry, no rashes  Psych: A&Ox3   Lab Results:  Recent Labs    12/22/19 1413 12/23/19 0509  WBC 14.7* 11.9*  HGB 15.8 14.7  HCT 47.3 44.0  PLT 180 173   BMET Recent Labs    12/22/19 1413 12/23/19 0509  NA 136 138  K 4.2 3.9  CL 103 104  CO2 25 27  GLUCOSE 110* 123*  BUN 19 16  CREATININE 0.93 0.89  CALCIUM 8.6* 8.4*   PT/INR No results for input(s): LABPROT, INR in the last 72 hours. CMP     Component Value Date/Time   NA 138 12/23/2019 0509   K 3.9 12/23/2019 0509   CL 104 12/23/2019 0509   CO2 27 12/23/2019 0509   GLUCOSE 123 (H) 12/23/2019 0509   BUN 16 12/23/2019 0509   CREATININE 0.89 12/23/2019 0509   CALCIUM 8.4 (L) 12/23/2019 0509   PROT 7.3 12/22/2019 1413   ALBUMIN 4.3 12/22/2019 1413   AST 20 12/22/2019 1413   ALT 19 12/22/2019 1413   ALKPHOS 62 12/22/2019 1413    BILITOT 1.6 (H) 12/22/2019 1413   GFRNONAA >60 12/23/2019 0509   GFRAA >60 12/23/2019 0509   Lipase     Component Value Date/Time   LIPASE 24 12/22/2019 1413       Studies/Results: DG Abdomen 1 View  Result Date: 12/22/2019 CLINICAL DATA:  Enteric catheter placement, small-bowel obstruction EXAM: ABDOMEN - 1 VIEW COMPARISON:  12/22/2019 FINDINGS: Frontal view of the lower chest and upper abdomen demonstrates enteric catheter tip projecting over gastric fundus. Dilated loops of small bowel consistent with obstruction. Lung bases are clear. IMPRESSION: 1. Enteric catheter overlying gastric fundus. Electronically Signed   By: Sharlet Salina M.D.   On: 12/22/2019 18:53   CT ABDOMEN PELVIS W CONTRAST  Result Date: 12/22/2019 CLINICAL DATA:  Left-sided abdominal pain, onset yesterday. Nausea and vomiting today. Left lower quadrant/suprapubic pain. EXAM: CT ABDOMEN AND PELVIS WITH CONTRAST TECHNIQUE: Multidetector CT imaging of the abdomen and pelvis was performed using the standard protocol following bolus administration of intravenous contrast. CONTRAST:  OMNIPAQUE IOHEXOL 300 MG/ML  SOLN COMPARISON:  CT 02/03/2014 FINDINGS: Lower chest: Lung bases are clear. Hepatobiliary: Mild decreased hepatic density consistent with steatosis. Tiny hypodensity in the left lobe of the liver is too small to accurately characterize. Gallbladder physiologically distended, no calcified stone. No biliary dilatation. Pancreas: No ductal  dilatation or inflammation. Spleen: Normal in size without focal abnormality. Adrenals/Urinary Tract: Normal adrenal glands. No hydronephrosis or perinephric edema. Homogeneous renal enhancement with symmetric excretion on delayed phase imaging. Urinary bladder is physiologically distended without wall thickening. Stomach/Bowel: Distended stomach. Dilated fluid-filled small bowel with fecalization of small bowel contents in the lower pelvis. Transition point just distal to fecalized  small bowel in the central abdomen to the right of midline, series 5, image 32. There is mesenteric engorgement and inflammation in the region of transition. More distal small bowel are decompressed. Appendix not definitively visualized. Formed stool in the proximal colon, remainder of the colon is decompressed. Mild distal colonic diverticulosis without diverticulitis. Vascular/Lymphatic: Normal caliber abdominal aorta. The portal vein is patent. No adenopathy. Reproductive: Prostate is unremarkable. Other: Mesenteric edema in the region of small bowel transition. Small amount of mesenteric free fluid the tracks into the pelvis. There is no free intra-abdominal air or organized fluid collection. 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 Vertebral body hemangioma within L2. Few scattered bone islands in the pelvis. IMPRESSION: 1. Small bowel obstruction with transition point in the central abdomen to the right of midline, likely secondary to adhesions. 2. Mild distal colonic diverticulosis without diverticulitis. 3. 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 4. Mild hepatic steatosis. Electronically Signed   By: Keith Rake M.D.   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 Some contrast in cecum vs just stool; will await final read from radiology.  - clinically patient remains obstructed, continue NG to LIWS, repeat KUB in AM - OOB/mobilize   FEN: NPO, IVF, NGT to LIWS ID: none VTE: SCD's, SQH Foley: none Follow up: TBD    LOS: 1 day    Obie Dredge, [facility] Please see Amion for pager number during day hours  7:00am-4:30pm

## 2019-12-24 LAB — BASIC METABOLIC PANEL
Anion gap: 9 (ref 5–15)
BUN: 15 mg/dL (ref 6–20)
CO2: 26 mmol/L (ref 22–32)
Calcium: 8.6 mg/dL — ABNORMAL LOW (ref 8.9–10.3)
Chloride: 105 mmol/L (ref 98–111)
Creatinine, Ser: 0.76 mg/dL (ref 0.61–1.24)
GFR calc Af Amer: >60 mL/min (ref >60)
GFR calc non Af Amer: >60 mL/min (ref >60)
Glucose, Bld: 119 mg/dL — ABNORMAL HIGH (ref 70–99)
Potassium: 4.1 mmol/L (ref 3.5–5.1)
Sodium: 140 mmol/L (ref 135–145)

## 2019-12-24 LAB — CBC
HCT: 42.1 % (ref 39.0–52.0)
Hemoglobin: 13.9 g/dL (ref 13.0–17.0)
MCH: 31 pg (ref 26.0–34.0)
MCHC: 33 g/dL (ref 30.0–36.0)
MCV: 94 fL (ref 80.0–100.0)
Platelets: 121 10*3/uL — ABNORMAL LOW (ref 150–400)
RBC: 4.48 MIL/uL (ref 4.22–5.81)
RDW: 12.5 % (ref 11.5–15.5)
WBC: 9.6 10*3/uL (ref 4.0–10.5)
nRBC: 0 % (ref 0.0–0.2)

## 2019-12-24 LAB — MAGNESIUM: Magnesium: 2.3 mg/dL (ref 1.7–2.4)

## 2019-12-24 MED ORDER — ACETAMINOPHEN 325 MG PO TABS
650.0000 mg | ORAL_TABLET | Freq: Four times a day (QID) | ORAL | Status: DC | PRN
  Administered 2019-12-24: 650 mg via ORAL
  Filled 2019-12-24: qty 2

## 2019-12-24 NOTE — Progress Notes (Signed)
Patient ID: George Ruiz, male   DOB: 07/26/1960, 59 y.o.   MRN: [id number]   Acute Care Surgery Service Progress Note:    Chief Complaint/Subjective: No abd pain Just didn't sleep well - difft environment, bed, etc No n/v +flatus  Objective: Vital signs in last 24 hours: Temp:  [97.8 F (36.6 C)-98.7 F (37.1 C)] 98.7 F (37.1 C) (06/11 0517) Pulse Rate:  [59-69] 69 (06/11 0517) Resp:  [17-18] 18 (06/11 0517) BP: (125-148)/(71-82) 139/76 (06/11 0517) SpO2:  [97 %-100 %] 100 % (06/11 0517) Last BM Date: 12/22/19 (had a fleet enema)  Intake/Output from previous day: 06/10 0701 - 06/11 0700 In: 1778.2 [P.O.:60; I.V.:1718.2] Out: 1500 [Emesis/NG output:1500] Intake/Output this shift: No intake/output data recorded.  Lungs: cta, nonlabored  Cardiovascular: reg  Abd: soft, old scars, nd, nontender  Extremities: no edema, +SCDs  Neuro: alert, nonfocal  Lab Results: CBC  Recent Labs    12/23/19 0509 12/24/19 0450  WBC 11.9* 9.6  HGB 14.7 13.9  HCT 44.0 42.1  PLT 173 121*   BMET Recent Labs    12/23/19 0509 12/24/19 0450  NA 138 140  K 3.9 4.1  CL 104 105  CO2 27 26  GLUCOSE 123* 119*  BUN 16 15  CREATININE 0.89 0.76  CALCIUM 8.4* 8.6*   LFT Hepatic Function Latest Ref Rng & Units 12/22/2019 02/04/2014 02/03/2014  Total Protein 6.5 - 8.1 g/dL 7.3 6.1 8.9(H)  Albumin 3.5 - 5.0 g/dL 4.3 3.3(L) 5.0  AST 15 - 41 U/L '20 18 24  ' ALT 0 - 44 U/L '19 15 20  ' Alk Phosphatase 38 - 126 U/L 62 49 72  Total Bilirubin 0.3 - 1.2 mg/dL 1.6(H) 1.2 2.1(H)   PT/INR No results for input(s): LABPROT, INR in the last 72 hours. ABG No results for input(s): PHART, HCO3 in the last 72 hours.  Invalid input(s): PCO2, PO2  Studies/Results:  Anti-infectives: Anti-infectives (From admission, onward)   None      Medications: Scheduled Meds: . heparin  5,000 Units Subcutaneous Q8H  . pantoprazole (PROTONIX) IV  40 mg Intravenous QHS  . sucralfate  1 g Oral Once    Continuous Infusions: . dextrose 5 % and 0.9 % NaCl with KCl 20 mEq/L 100 mL/hr at 12/23/19 2132   PRN Meds:.morphine injection, ondansetron **OR** ondansetron (ZOFRAN) IV  Assessment/Plan: Patient Active Problem List   Diagnosis Date Noted  . Right knee injury, subsequent encounter 07/17/2017  . Small bowel obstruction ([place]) 02/03/2014  . SBO (small bowel obstruction) ([place]) 02/03/2014   GERD - PPI  SBO  - afebrile, VSS -exam benign - suspect related to adhesive disease - SB protocol 24hr delayed film shows contrast thru entire colon, with a few dilated SB loops - OOB/mobilize   FEN: clear, IVF, NGT clamp ID: none VTE: SCD's, SQH Foley: none Follow up: TBD  Disposition: will clamp NG and allow clears this am  LOS: 2 days    Leighton Ruff. Redmond Pulling, MD, FACS General, Bariatric, & Minimally Invasive Surgery [phone] [facility]

## 2019-12-25 NOTE — Discharge Instructions (Signed)
Bowel Obstruction A bowel obstruction is a blockage in the small or large bowel. The bowel, which is also called the intestine, is a long, slender tube that connects the stomach to the anus. When a person eats and drinks, food and fluids go from the mouth to the stomach to the small bowel. This is where most of the nutrients in the food and fluids are absorbed. After the small bowel, material passes through the large bowel for further absorption until any leftover material leaves the body as stool through the anus during a bowel movement. A bowel obstruction will prevent food and fluids from passing through the bowel as they normally do during digestion. The bowel can become partially or completely blocked. If this condition is not treated, it can be dangerous because the bowel could rupture. What are the causes? Common causes of this condition include:  Scar tissue (adhesions) from previous surgery or treatment with high-energy X-rays (radiation).  Recent surgery. This may cause the movements of the bowel to slow down and cause food to block the intestine.  Inflammatory bowel disease, such as Crohn's disease or diverticulitis.  Growths or tumors.  A bulging organ (hernia).  Twisting of the bowel (volvulus).  A foreign body.  Slipping of a part of the bowel into another part (intussusception). What are the signs or symptoms? Symptoms of this condition include:  Pain in the abdomen. Depending on the degree of obstruction, pain may be: ? Mild or severe. ? Dull cramping or sharp pain. ? In one area or in the entire abdomen.  Nausea and vomiting. Vomit may be greenish or a yellow bile color.  Bloating in the abdomen.  Difficulty passing stool (constipation).  Lack of passing gas.  Frequent belching.  Diarrhea. This may occur if the obstruction is partial and runny stool is able to leak around the obstruction. How is this diagnosed? This condition may be diagnosed based on:  A  physical exam.  Medical history.  Imaging tests of the abdomen or pelvis, such as X-ray or CT scan.  Blood or urine tests. How is this treated? Treatment for this condition depends on the cause and severity of the problem. Treatment may include:  Fluids and pain medicines that are given through an IV. Your health care provider may instruct you not to eat or drink if you have nausea or vomiting.  Eating a simple diet. You may be asked to consume a clear liquid diet for several days. This allows the bowel to rest.  Placement of a small tube (nasogastric tube) into the stomach. This will relieve pain, discomfort, and nausea by removing blocked air and fluids from the stomach. It can also help the obstruction clear up faster.  Surgery. This may be required if other treatments do not work. Surgery may be required for: ? Bowel obstruction from a hernia. This can be an emergency procedure. ? Scar tissue that causes frequent or severe obstructions. Follow these instructions at home: Medicines  Take over-the-counter and prescription medicines only as told by your health care provider.  If you were prescribed an antibiotic medicine, take it as told by your health care provider. Do not stop taking the antibiotic even if you start to feel better. General instructions  Follow instructions from your health care provider about eating restrictions. You may need to avoid solid foods and consume only clear liquids until your condition improves.  Return to your normal activities as told by your health care provider. Ask your health care   provider what activities are safe for you.  Avoid sitting for a long time without moving. Get up to take short walks every 1-2 hours. This is important to improve blood flow and breathing. Ask for help if you feel weak or unsteady.  Keep all follow-up visits as told by your health care provider. This is important. How is this prevented? After having a bowel  obstruction, you are more likely to have another. You may do the following things to prevent another obstruction:  If you have a long-term (chronic) disease, pay attention to your symptoms and contact your health care provider if you have questions or concerns.  Avoid becoming constipated. To prevent or treat constipation, your health care provider may recommend that you: ? Drink enough fluid to keep your urine pale yellow. ? Take over-the-counter or prescription medicines. ? Eat foods that are high in fiber, such as beans, whole grains, and fresh fruits and vegetables. ? Limit foods that are high in fat and processed sugars, such as fried or sweet foods.  Stay active. Exercise for 30 minutes or more, 5 or more days each week. Ask your health care provider which exercises are safe for you.  Avoid stress. Find ways to reduce stress, such as meditation, exercise, or taking time for activities that relax you.  Instead of eating three large meals each day, eat three small meals with three small snacks.  Work with a dietitian to make a healthy meal plan that works for you.  Do not use any products that contain nicotine or tobacco, such as cigarettes and e-cigarettes. If you need help quitting, ask your health care provider. Contact a health care provider if you:  Have a fever.  Have chills. Get help right away if you:  Have increased pain or cramping.  Vomit blood.  Have uncontrolled vomiting or nausea.  Cannot drink fluids because of vomiting or pain.  Become confused.  Begin feeling very thirsty (dehydrated).  Have severe bloating.  Feel extremely weak or you faint. Summary  A bowel obstruction is a blockage in the small or large bowel.  A bowel obstruction will prevent food and fluids from passing through the bowel as they normally do during digestion.  Treatment for this condition depends on the cause and severity of the problem. It may include fluids and pain medicines  through an IV, a simple diet, a nasogastric tube, or surgery.  Follow instructions from your health care provider about eating restrictions. You may need to avoid solid foods and consume only clear liquids until your condition improves. This information is not intended to replace advice given to you by your health care provider. Make sure you discuss any questions you have with your health care provider. Document Revised: 08/07/2018 Document Reviewed: 11/12/2017 Elsevier Patient Education  2020 [employer]  

## 2019-12-25 NOTE — Progress Notes (Signed)
Nurse reviewed discharge instructions with pt.  Pt verbalized understanding of discharge instructions.  No concerns at time of discharge.

## 2019-12-25 NOTE — Progress Notes (Signed)
Patient ID: George Ruiz, male   DOB: 09-23-1960, 59 y.o.   MRN: [id number]   Acute Care Surgery Service Progress Note:    Chief Complaint/Subjective: Tolerating liquids without issue, had 2 bowel movements  Objective: Vital signs in last 24 hours: Temp:  [97.7 F (36.5 C)-99.4 F (37.4 C)] 97.7 F (36.5 C) (06/12 0615) Pulse Rate:  [56-75] 56 (06/12 0615) Resp:  [14-17] 16 (06/12 0615) BP: (115-126)/(70-87) 115/74 (06/12 0615) SpO2:  [98 %-99 %] 99 % (06/12 0615) Last BM Date: 12/24/19  Intake/Output from previous day: 06/11 0701 - 06/12 0700 In: 2483.1 [P.O.:1040; I.V.:1443.1] Out: 0  Intake/Output this shift: No intake/output data recorded.  Lungs: cta, nonlabored  Cardiovascular: reg  Abd: soft, nd, nontender  Extremities: no edema, +SCDs  Neuro: alert, nonfocal  Lab Results: CBC  Recent Labs    12/23/19 0509 12/24/19 0450  WBC 11.9* 9.6  HGB 14.7 13.9  HCT 44.0 42.1  PLT 173 121*   BMET Recent Labs    12/23/19 0509 12/24/19 0450  NA 138 140  K 3.9 4.1  CL 104 105  CO2 27 26  GLUCOSE 123* 119*  BUN 16 15  CREATININE 0.89 0.76  CALCIUM 8.4* 8.6*   LFT Hepatic Function Latest Ref Rng & Units 12/22/2019 02/04/2014 02/03/2014  Total Protein 6.5 - 8.1 g/dL 7.3 6.1 8.9(H)  Albumin 3.5 - 5.0 g/dL 4.3 3.3(L) 5.0  AST 15 - 41 U/L '20 18 24  ' ALT 0 - 44 U/L '19 15 20  ' Alk Phosphatase 38 - 126 U/L 62 49 72  Total Bilirubin 0.3 - 1.2 mg/dL 1.6(H) 1.2 2.1(H)   PT/INR No results for input(s): LABPROT, INR in the last 72 hours. ABG No results for input(s): PHART, HCO3 in the last 72 hours.  Invalid input(s): PCO2, PO2  Studies/Results:  Anti-infectives: Anti-infectives (From admission, onward)   None      Medications: Scheduled Meds: . heparin  5,000 Units Subcutaneous Q8H  . pantoprazole (PROTONIX) IV  40 mg Intravenous QHS  . sucralfate  1 g Oral Once   Continuous Infusions: . dextrose 5 % and 0.9 % NaCl with KCl 20 mEq/L 50 mL/hr at  12/25/19 0036   PRN Meds:.acetaminophen, morphine injection, ondansetron **OR** ondansetron (ZOFRAN) IV  Assessment/Plan: Patient Active Problem List   Diagnosis Date Noted  . Right knee injury, subsequent encounter 07/17/2017  . Small bowel obstruction ([place]) 02/03/2014  . 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

## 2019-12-25 NOTE — Discharge Summary (Signed)
Physician Discharge Summary  Patient ID: George Ruiz MRN: [id number] DOB/AGE: Dec 02, 1960 59 y.o.  Admit date: 12/22/2019 Discharge date: 12/26/2019  Admission Diagnoses: small bowel obstruction  Discharge Diagnoses:  Active Problems:   SBO (small bowel obstruction) (HCC)   Discharged Condition: good  Hospital Course: Admitted for bowel rest and supportive care. Bowel obstruction resolved. On day of discharge patient tolerating soft diet, having bowel movements and without abdominal pain   Discharge Exam: Blood pressure 115/74, pulse (!) 56, temperature 97.7 F (36.5 C), temperature source Oral, resp. rate 16, height 6' (1.829 m), weight 94.6 kg, SpO2 99 %. See rounding note  Disposition: Discharge disposition: 01-Home or Self Care      Discharge Instructions     Diet general   Complete by: As directed    Stay on a soft diet for the next few days at home, then slowly advance to regular   Increase activity slowly   Complete by: As directed       Allergies as of 12/25/2019   No Known Allergies      Medication List     TAKE these medications    aspirin EC 81 MG tablet Take 81 mg by mouth daily.   cyclobenzaprine 10 MG tablet Commonly known as: FLEXERIL Take 1 tablet (10 mg total) by mouth 2 (two) times daily as needed for muscle spasms.   diclofenac 75 MG EC tablet Commonly known as: VOLTAREN Take 1 tablet (75 mg total) by mouth 2 (two) times daily.   lidocaine 5 % Commonly known as: Lidoderm Place 1 patch onto the skin daily. Remove & Discard patch within 12 hours or as directed by MD   multivitamin with minerals Tabs tablet Take 1 tablet by mouth daily.   omeprazole 40 MG capsule Commonly known as: PRILOSEC Take 40 mg by mouth daily.          Signed: Berna Bue 12/26/2019, 7:10 AM

## 2021-04-06 ENCOUNTER — Ambulatory Visit
Admission: RE | Admit: 2021-04-06 | Discharge: 2021-04-06 | Disposition: A | Discharge status: Home / Self Care | Payer: 59 | Source: Ambulatory Visit | Attending: Sports Medicine | Admitting: Sports Medicine

## 2021-04-06 ENCOUNTER — Other Ambulatory Visit: Payer: Self-pay | Admitting: Sports Medicine

## 2021-04-06 DIAGNOSIS — M25562 Pain in left knee: Secondary | ICD-10-CM

## 2022-02-17 NOTE — Progress Notes (Signed)
Cardiology Office Note   Date:  02/19/2022   ID:  George Ruiz, DOB 1961/04/29, MRN [id number]  PCP:  Blair Heys, MD  Cardiologist:   Leodis Alcocer [place], MD   Chief Complaint  Patient presents with   Bradycardia      History of Present Illness: George Ruiz is a 61 y.o. male who is seen at the request of Dr Manus Gunning for evaluation of bradycardia. He is in excellent health. He has noted over the last few months that his HR will drop according to his Garmin watch. May get down to 39 bpm while awake and at rest. Denies any dizziness or syncope but does complain of burning and tingling in his feet and legs. States he awoke one night with his left arm numb (normal muscular function). Does hike 5-7 miles regularly. HR will get up to 130-150 with exercise. Denies any chest pain or dyspnea. Usually HR is slow while sitting.    Past Medical History:  Diagnosis Date   Bowel obstruction (HCC)    Cellulitis of right anterior lower leg    GERD (gastroesophageal reflux disease)    HOH (hard of hearing)     Past Surgical History:  Procedure Laterality Date   COLON SURGERY       Current Outpatient Medications  Medication Sig Dispense Refill   aspirin EC 81 MG tablet Take 81 mg by mouth daily.      cyclobenzaprine (FLEXERIL) 10 MG tablet Take 1 tablet (10 mg total) by mouth 2 (two) times daily as needed for muscle spasms. 20 tablet 0   Multiple Vitamin (MULTIVITAMIN WITH MINERALS) TABS tablet Take 1 tablet by mouth daily.     omeprazole (PRILOSEC) 40 MG capsule Take 40 mg by mouth daily.     No current facility-administered medications for this visit.    Allergies:   Patient has no known allergies.    Social History:  The patient  reports that he has never smoked. He has never used smokeless tobacco. He reports current alcohol use. He reports that he does not use drugs.   Family History:  The patient's family history includes Cancer - Other in his mother.    ROS:  Please see  the history of present illness.   Otherwise, review of systems are positive for none.   All other systems are reviewed and negative.    PHYSICAL EXAM: VS:  BP 108/66   Pulse 74   Ht 5\' 11"  (1.803 m)   Wt 218 lb 6.4 oz (99.1 kg)   SpO2 100%   BMI 30.46 kg/m  , BMI Body mass index is 30.46 kg/m. GEN: Well nourished, well developed, in no acute distress HEENT: normal Neck: no JVD, carotid bruits, or masses Cardiac: RRR; no murmurs, rubs, or gallops,no edema  Respiratory:  clear to auscultation bilaterally, normal work of breathing GI: soft, nontender, nondistended, + BS MS: no deformity or atrophy Skin: warm and dry, no rash Neuro:  Strength and sensation are intact Psych: euthymic mood, full affect   EKG:  EKG is ordered today. The ekg ordered today demonstrates NSR rate 74. Normal. I have personally reviewed and interpreted this study.    Recent Labs: No results found for requested labs within last 365 days.   Dated 11/15/19: normal CMET  Dated 11/30/21: cholesterol 140, triglycerides 117, HDL 32, LDL 87. CBC and TSH normal.   Lipid Panel No results found for: "CHOL", "TRIG", "HDL", "CHOLHDL", "VLDL", "LDLCALC", "LDLDIRECT"    Wt  Readings from Last 3 Encounters:  02/19/22 218 lb 6.4 oz (99.1 kg)  12/22/19 208 lb 9.6 oz (94.6 kg)  01/13/19 240 lb (108.9 kg)      Other studies Reviewed: Additional studies/ records that were reviewed today include: none. Review of the above records demonstrates: N/A   ASSESSMENT AND PLAN:  1.  Bradycardia - noted on HR monitor. No clear symptoms although describes numbness, tingling and burning  in his legs which he feels is related. Normal Ecg and exam today. Labs OK recently. To assess will have him wear a 48 hour Holter monitor.    Current medicines are reviewed at length with the patient today.  The patient does not have concerns regarding medicines.  The following changes have been made:  no change  Labs/ tests ordered today  include:   Orders Placed This Encounter  Procedures   Holter monitor - 48 hour   EKG 12-Lead         Disposition:   TBD  Signed, Vandora Jaskulski [place], MD  02/19/2022 4:15 PM    [facility] [address] [place], [place], [address] Phone [phone], Fax [phone]

## 2022-02-19 ENCOUNTER — Encounter: Payer: Self-pay | Admitting: Cardiology

## 2022-02-19 ENCOUNTER — Ambulatory Visit (INDEPENDENT_AMBULATORY_CARE_PROVIDER_SITE_OTHER): Payer: 59

## 2022-02-19 ENCOUNTER — Ambulatory Visit (INDEPENDENT_AMBULATORY_CARE_PROVIDER_SITE_OTHER): Payer: 59 | Admitting: Cardiology

## 2022-02-19 VITALS — BP 108/66 | HR 74 | Ht 71.0 in | Wt 218.4 lb

## 2022-02-19 DIAGNOSIS — I495 Sick sinus syndrome: Secondary | ICD-10-CM

## 2022-02-19 NOTE — Progress Notes (Unsigned)
Enrolled for [employer] to mail a ZIO XT long term holter monitor to the patients address on file.  

## 2022-02-19 NOTE — Patient Instructions (Signed)
Medication Instructions:  Continue same medications   Lab Work: None ordered   Testing/Procedures: 48 hour monitor    Follow-Up: At [facility], you and your health needs are our priority.  As part of our continuing mission to provide you with exceptional heart care, we have created designated Provider Care Teams.  These Care Teams include your primary Cardiologist (physician) and Advanced Practice Providers (APPs -  Physician Assistants and Nurse Practitioners) who all work together to provide you with the care you need, when you need it.  We recommend signing up for the patient portal called "MyChart".  Sign up information is provided on this After Visit Summary.  MyChart is used to connect with patients for Virtual Visits (Telemedicine).  Patients are able to view lab/test results, encounter notes, upcoming appointments, etc.  Non-urgent messages can be sent to your provider as well.   To learn more about what you can do with MyChart, go to [email or web address].       Your next appointment:  To Be Determined after monitor    The format for your next appointment: Office   Provider:  Dr.Jordan {  Important Information About Sugar

## 2022-02-25 DIAGNOSIS — I495 Sick sinus syndrome: Secondary | ICD-10-CM

## 2022-03-11 ENCOUNTER — Telehealth: Payer: Self-pay | Admitting: Cardiology

## 2022-03-11 NOTE — Telephone Encounter (Signed)
Patient returned call for his monitor results.  

## 2022-03-12 NOTE — Telephone Encounter (Signed)
Spoke to patient 8/28 monitor results given.

## 2023-09-06 IMAGING — DX DG KNEE 3 VIEWS*L*
3 series · 3 of 3 positions shown · non-contrast
Comparison: None.

CLINICAL DATA: Left knee pain

EXAM:
LEFT KNEE - 3 VIEW

[dg knee 3 views left (1 of 3)]
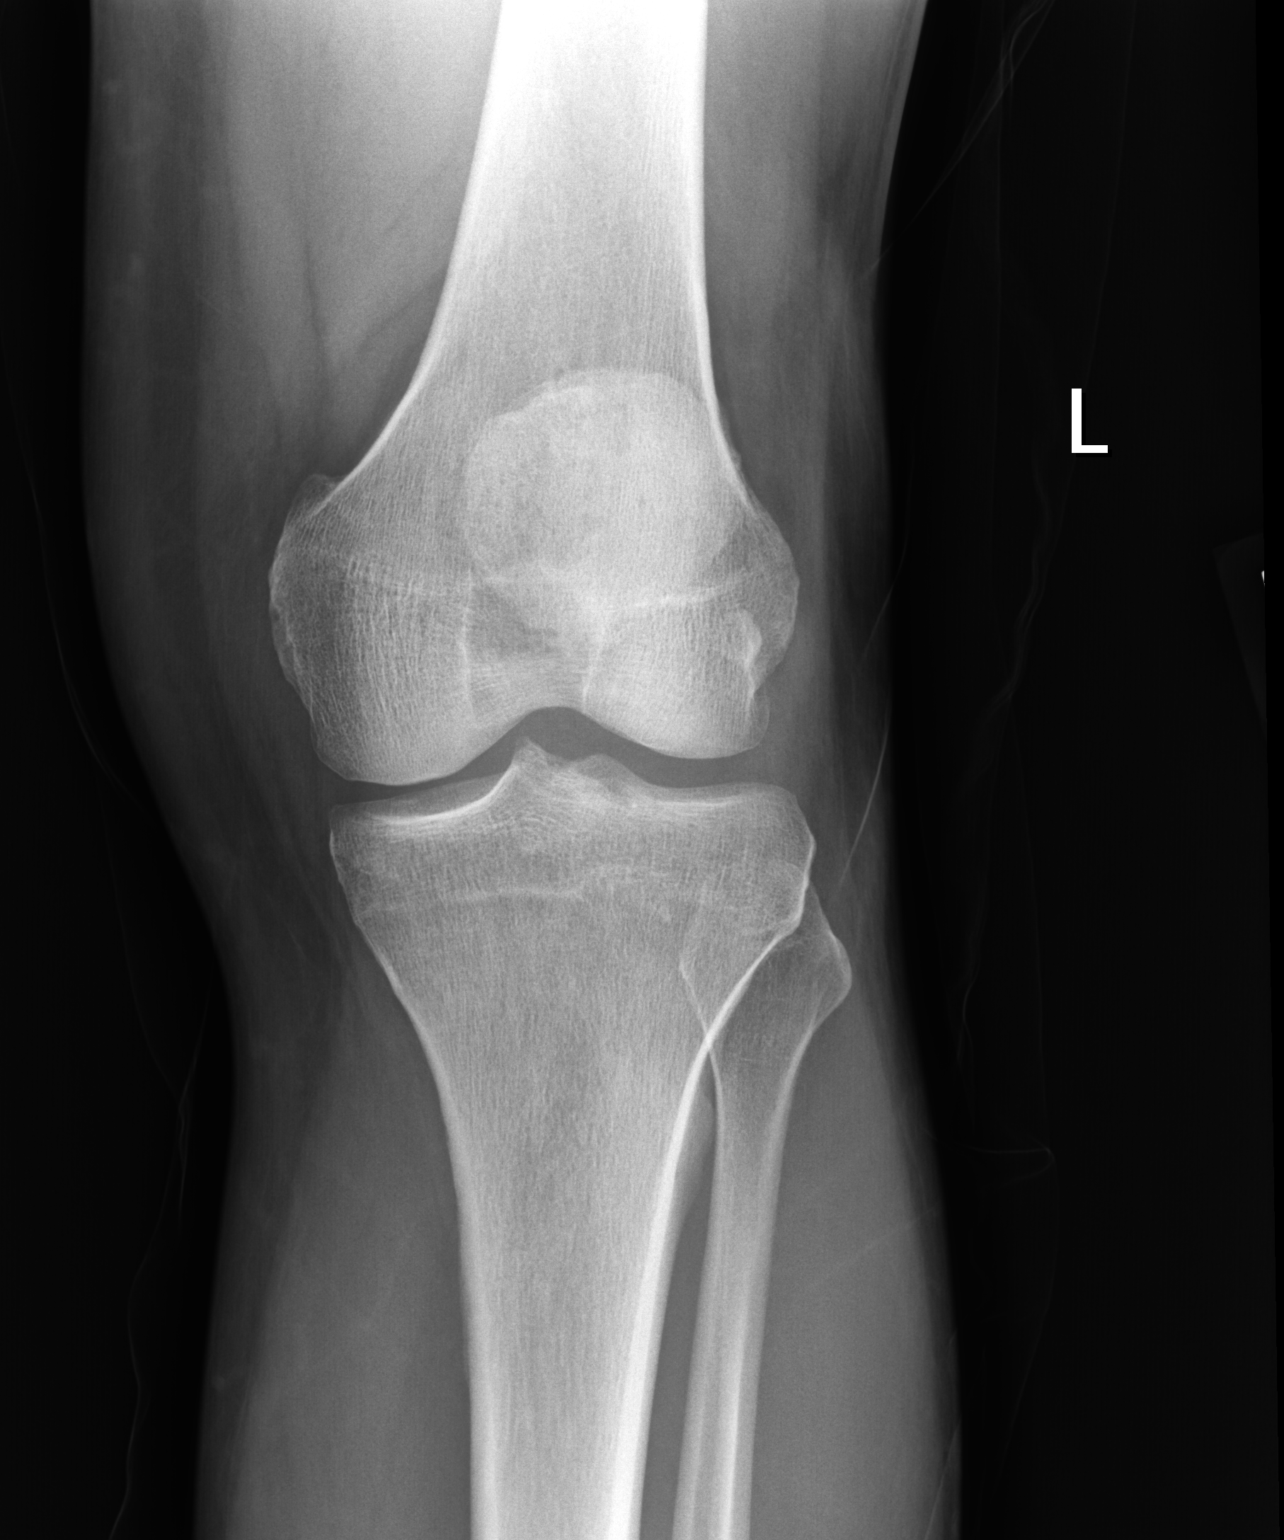

[dg knee 3 views left (2 of 3)]
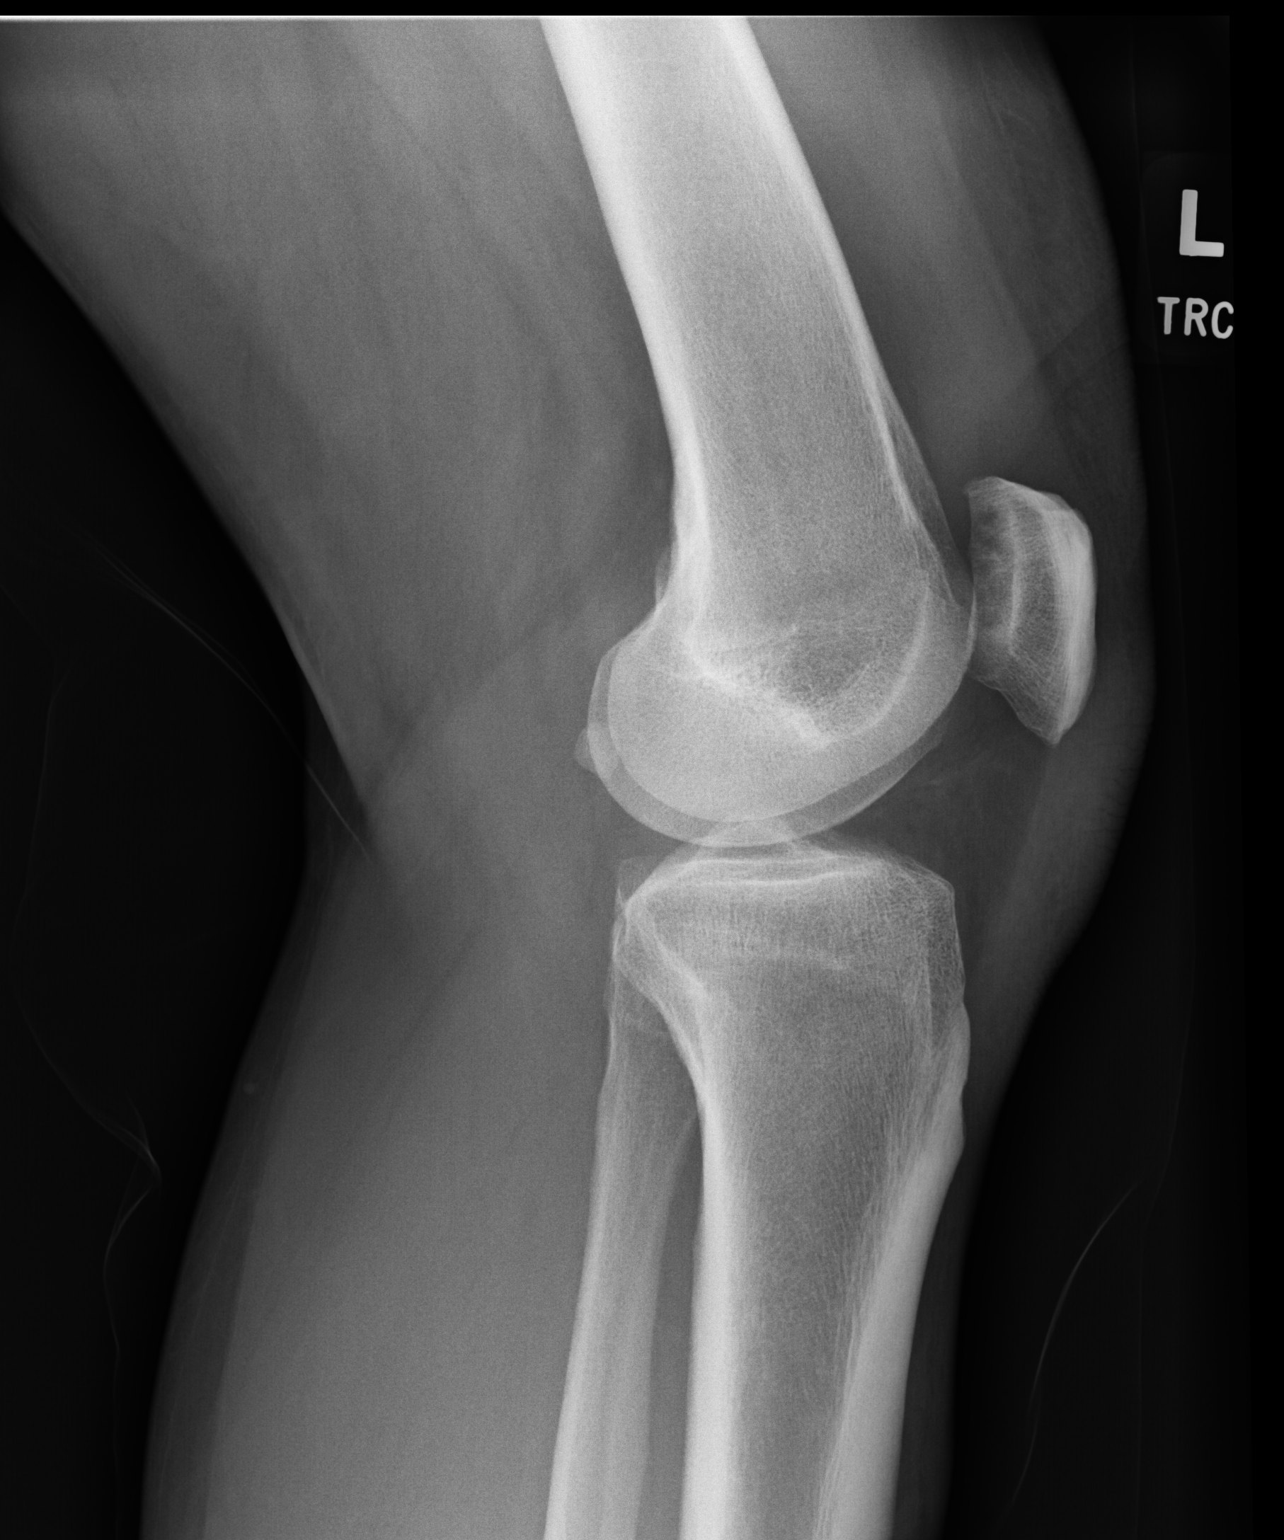

[dg knee 3 views left (3 of 3)]
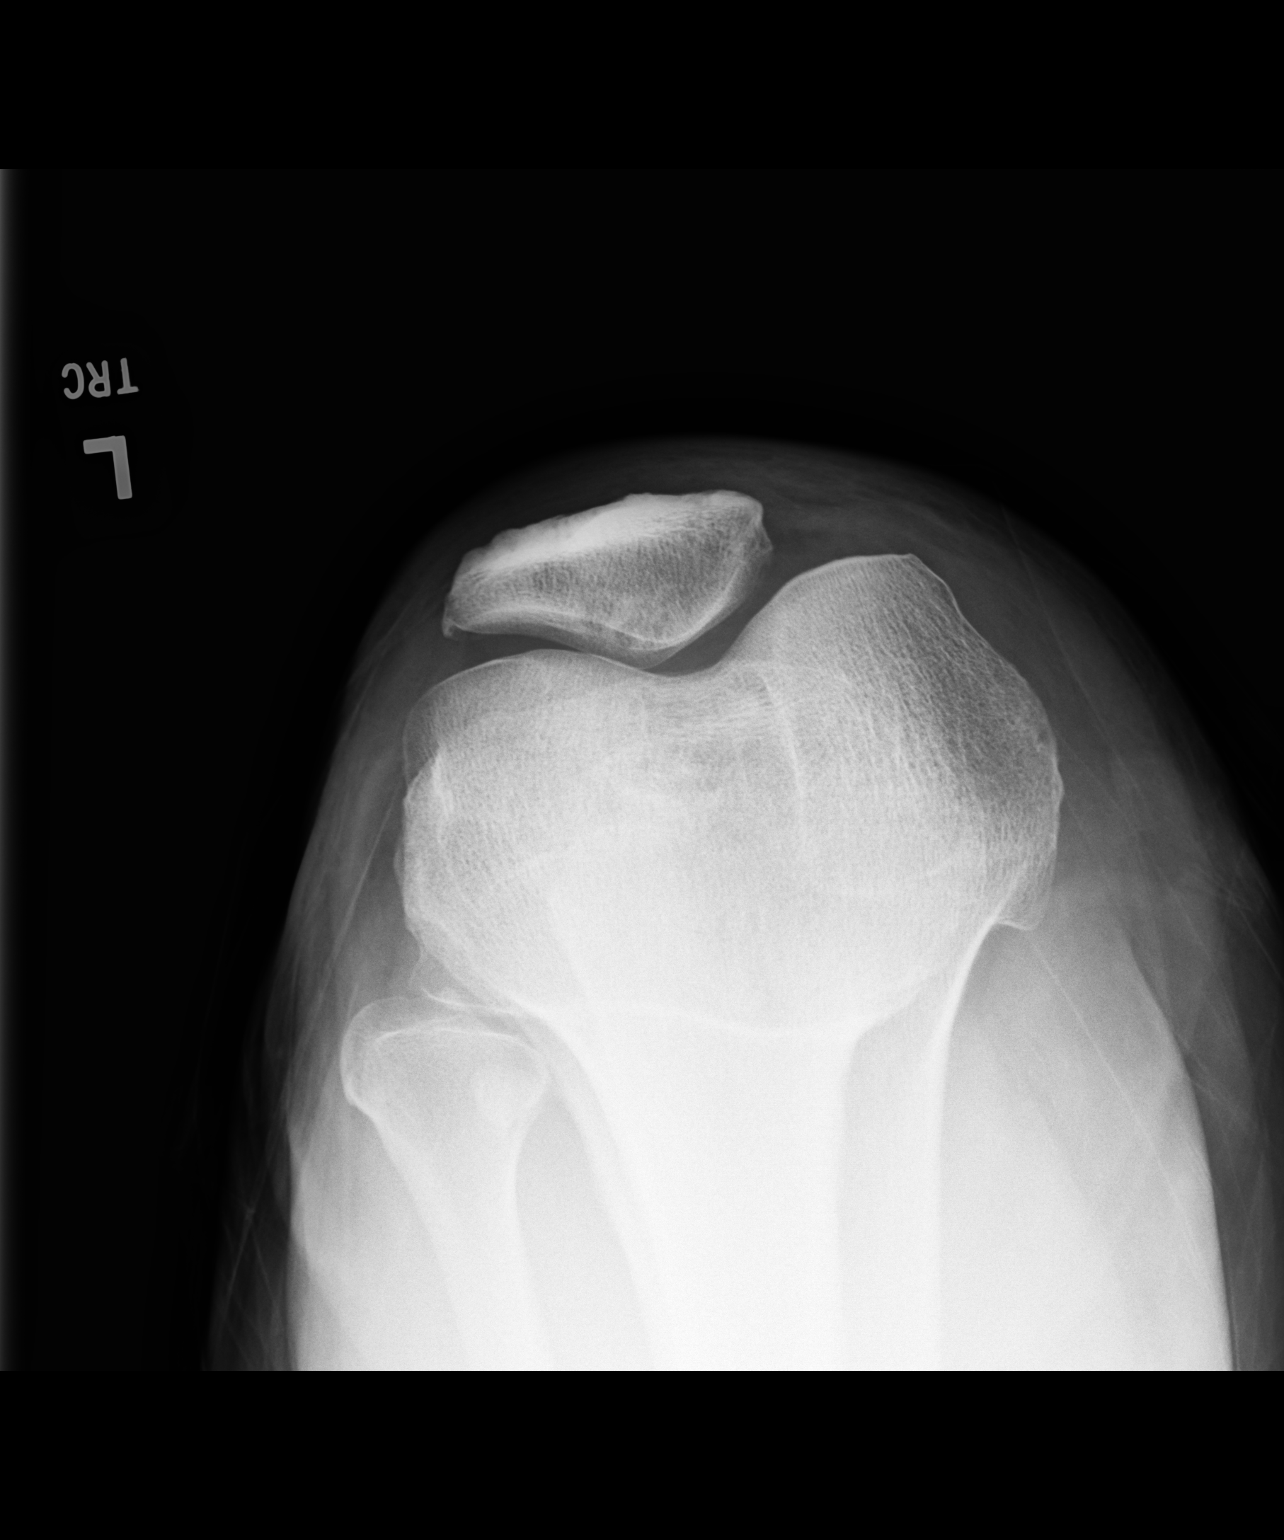

[3 of 3 positions shown; findings below may reference images not displayed]

FINDINGS: No fracture or malalignment. Mild patellofemoral degenerative
change. Small knee effusion
IMPRESSION: Mild degenerative change with effusion.

## 2024-05-06 ENCOUNTER — Encounter (INDEPENDENT_AMBULATORY_CARE_PROVIDER_SITE_OTHER): Payer: Self-pay | Admitting: Otolaryngology

## 2024-05-06 ENCOUNTER — Ambulatory Visit (INDEPENDENT_AMBULATORY_CARE_PROVIDER_SITE_OTHER): Admitting: Otolaryngology

## 2024-05-06 VITALS — BP 129/85 | HR 66 | Ht 71.0 in | Wt 224.0 lb

## 2024-05-06 DIAGNOSIS — Z87891 Personal history of nicotine dependence: Secondary | ICD-10-CM

## 2024-05-06 DIAGNOSIS — J351 Hypertrophy of tonsils: Secondary | ICD-10-CM

## 2024-05-06 DIAGNOSIS — G4733 Obstructive sleep apnea (adult) (pediatric): Secondary | ICD-10-CM

## 2024-05-06 NOTE — Progress Notes (Signed)
 Dear Dr. Harl, Here is my assessment for our mutual patient, George Ruiz. Thank you for allowing me the opportunity to care for your patient. Please do not hesitate to contact me should you have any other questions. Sincerely, Dr. Eldora Blanch  Otolaryngology Clinic Note Referring provider: Dr. Harl HPI:  George Ruiz is a 63 y.o. male kindly referred by Dr. Harl for evaluation of obstructive sleep apnea  Initial visit (04/2024):  Discussed the use of AI scribe software for clinical note transcription with the patient, who gave verbal consent to proceed.  History of Present Illness George Ruiz is a 63 year old male who presents for evaluation of sleep apnea  He experiences symptoms related to sleep apnea, including poor sleep quality and snoring. He saw Dr. Harl for it, who did a home sleep study shows an apnea-hypopnea index (AHI) of 14.2. He has not used a CPAP machine and is reluctant to do so. Dr. Harl noted significant tonsillar hypertrophy and referred for potential surgical management. This is significantly impacting his QoL  Denies h/o strep infections, B symptoms. He has not had any prior ear, nose, or throat surgeries.    He used to smoke but has quit.  He does have dysphagia for which he follows with GI with recent dilation helpful   Insomnia: no Denies dysphonia, cough, SOB Workup so far: sleep study (home)  ENT Surgery: no Personal or FHx of bleeding dz or anesthesia difficulty: no  AP/AC: ASA 81  Tobacco: former, quit  PMHx: GERD, AR, HLD, OSA, SBO  Independent Review of Additional Tests or Records:  Dr. Jeraline Referral notes reviewed and uploaded or available in chart in media tab (04/13/2024): Hst done 04/07/2024, AHI 14.2, noted snoring and witnessed apneas with 3+ tonsils. Dx: OSA, Tonsillar Hypertrophy; Rx: ref to ENT Sleep study (2025/03/2023) independently interpreted: AHI 14.2 (3%) -- 4% is 7.4 AHI, O2 nadir 78%; hpoxemia  >20%; central 0.6 CBC 12/24/2019: WBC 9.6, Hgb 13.9 PMH/Meds/All/SocHx/FamHx/ROS:   Past Medical History:  Diagnosis Date   Bowel obstruction (HCC)    Cellulitis of right anterior lower leg    GERD (gastroesophageal reflux disease)    HOH (hard of hearing)      Past Surgical History:  Procedure Laterality Date   COLON SURGERY      Family History  Problem Relation Age of Onset   Cancer - Other Mother      Social Connections: Not on file      Current Outpatient Medications:    aspirin EC 81 MG tablet, Take 81 mg by mouth daily. , Disp: , Rfl:    cyclobenzaprine  (FLEXERIL ) 10 MG tablet, Take 1 tablet (10 mg total) by mouth 2 (two) times daily as needed for muscle spasms., Disp: 20 tablet, Rfl: 0   Multiple Vitamin (MULTIVITAMIN WITH MINERALS) TABS tablet, Take 1 tablet by mouth daily., Disp: , Rfl:    omeprazole (PRILOSEC) 40 MG capsule, Take 40 mg by mouth daily., Disp: , Rfl:    Physical Exam:   BP 129/85 (BP Location: Right Arm, Patient Position: Sitting)   Pulse 66   Ht 5' 11 (1.803 m)   Wt 224 lb (101.6 kg)   SpO2 95%   BMI 31.24 kg/m   Salient findings:  CN II-XII intact Bilateral EAC clear and TM intact with well pneumatized middle ear spaces Anterior rhinoscopy: Septum intact; bilateral inferior turbinates without significant hypertrophy No lesions of oral cavity/oropharynx; no significant retrognathia; Friedman tongue 2; tonsils 3+/3+, exophytic symmetric  palate elevation; seems to have more bulky lateral pharyngeal wall tissue No obviously palpable neck masses/lymphadenopathy/thyromegaly No respiratory distress or stridor; TFL was indicated to better evaluate the proximal airway, given the patient's history and exam findings, and is detailed below. BMI 31, relatively thin neck; hyoid fairly anterior  Seprately Identifiable Procedures:  Prior to initiating any procedures, risks/benefits/alternatives were explained to the patient and verbal consent  obtained. Procedure Note Pre-procedure diagnosis:  Obstructive sleep apnea, rule out structural cause Post-procedure diagnosis: Same Procedure: Transnasal Fiberoptic Laryngoscopy, CPT 31575 - Mod 25 Indication: see above Complications: None apparent EBL: 0 mL  The procedure was undertaken to further evaluate the patient's complaint above, with mirror exam inadequate for appropriate examination due to gag reflex and poor patient tolerance  Procedure:  Patient was identified as correct patient. Verbal consent was obtained. The nose was sprayed with oxymetazoline and 4% lidocaine . The The flexible laryngoscope was passed through the nose to view the nasal cavity, pharynx (oropharynx, hypopharynx) and larynx.  The larynx was examined at rest and during multiple phonatory tasks. Documentation was obtained and reviewed with patient. The scope was removed. The patient tolerated the procedure well.  Findings: The nasal cavity and nasopharynx did not reveal any masses or lesions, mucosa appeared to be without obvious lesions. The tongue base, pharyngeal walls, piriform sinuses, vallecula, epiglottis and postcricoid region are normal in appearance except fairly significant oropharyngeal narrowing due to tonsillar prominence and bulky lateral pharyngeal mucosa; Muller maneuver positive; Palate with good mobility; no significant adenoid bed -- based on muller, does not appear to have concentric but primarily lateral collapse; The visualized portion of the subglottis and proximal trachea is widely patent. The vocal folds are mobile bilaterally. There are no lesions on the free edge of the vocal folds nor elsewhere in the larynx worrisome for malignancy.    Electronically signed by: Eldora KATHEE Blanch, MD 05/06/2024 1:37 PM   Impression & Plans:  George Ruiz is a 62 y.o. male with:  1. OSA (obstructive sleep apnea)   2. Tonsillar hypertrophy    AHI 14.2, fairly significant hypoxemia. BMI 31. Patient  adamantly wishes to avoid CPAP. As such, given his anatomy, we discussed options -- do think he is a decent candidate for tonsillectomy and ESP. Other options including mandibular advancement (seems to aggressive), hyoid suspension (opted no) and Oral appliance (wishes to avoid) also discussed We discussed R/B/A for procedure  including significant post-op pain, bleeding (3%, including life threatening bleeding and requiring return to OR), infections, lack of improvement, VPI and dysphagia as well as persistent symptoms and risk of anesthesia. We also discussed post-op management and risks.    Patient would like to proceed  F/u 4 weeks post op  See below regarding exact medications prescribed this encounter including dosages and route: No orders of the defined types were placed in this encounter.     Thank you for allowing me the opportunity to care for your patient. Please do not hesitate to contact me should you have any other questions.  Sincerely, Eldora Blanch, MD Otolaryngologist (ENT), [facility] Phone: [phone] Fax: [phone]  05/06/2024, 1:37 PM   MDM:  Level 4 - 99204 Complexity/Problems addressed: mod - chronic problem Data complexity: mod - independent interpretation of outside test; review of note, labs - Morbidity: mod - decision for surgery  - Prescription Drug prescribed or managed: n

## 2024-08-19 ENCOUNTER — Ambulatory Visit (INDEPENDENT_AMBULATORY_CARE_PROVIDER_SITE_OTHER): Admitting: Otolaryngology

## 2024-08-24 ENCOUNTER — Ambulatory Visit (INDEPENDENT_AMBULATORY_CARE_PROVIDER_SITE_OTHER): Admitting: Otolaryngology

## 2024-09-16 ENCOUNTER — Ambulatory Visit (INDEPENDENT_AMBULATORY_CARE_PROVIDER_SITE_OTHER): Admitting: Otolaryngology
